# Patient Record
Sex: Male | Born: 1970 | Race: White | Hispanic: No | Marital: Married | Smoking: Never smoker
Health system: Southern US, Community
[De-identification: ages and names within clinical notes are randomized; demographics above are authoritative.]

## PROBLEM LIST (undated history)

## (undated) DIAGNOSIS — I10 Essential (primary) hypertension: Secondary | ICD-10-CM

## (undated) HISTORY — PX: ANTERIOR CRUCIATE LIGAMENT REPAIR: SHX115

---

## 1997-12-14 ENCOUNTER — Emergency Department (HOSPITAL_COMMUNITY): Admission: EM | Admit: 1997-12-14 | Discharge: 1997-12-14 | Payer: Self-pay | Admitting: Emergency Medicine

## 1997-12-21 ENCOUNTER — Emergency Department (HOSPITAL_COMMUNITY): Admission: EM | Admit: 1997-12-21 | Discharge: 1997-12-21 | Payer: Self-pay | Admitting: Emergency Medicine

## 2002-04-19 ENCOUNTER — Ambulatory Visit (HOSPITAL_COMMUNITY): Admission: RE | Admit: 2002-04-19 | Discharge: 2002-04-19 | Payer: Self-pay | Admitting: Family Medicine

## 2002-04-19 ENCOUNTER — Encounter: Payer: Self-pay | Admitting: Family Medicine

## 2002-08-27 ENCOUNTER — Emergency Department (HOSPITAL_COMMUNITY): Admission: EM | Admit: 2002-08-27 | Discharge: 2002-08-27 | Payer: Self-pay | Admitting: Emergency Medicine

## 2009-01-16 ENCOUNTER — Ambulatory Visit (HOSPITAL_COMMUNITY): Admission: RE | Admit: 2009-01-16 | Discharge: 2009-01-16 | Payer: Self-pay | Admitting: Family Medicine

## 2010-05-31 ENCOUNTER — Other Ambulatory Visit: Payer: Self-pay | Admitting: Urology

## 2010-05-31 ENCOUNTER — Other Ambulatory Visit (HOSPITAL_COMMUNITY)
Admission: RE | Admit: 2010-05-31 | Discharge: 2010-05-31 | Disposition: A | Discharge status: Home / Self Care | Payer: 59 | Source: Ambulatory Visit | Attending: Urology | Admitting: Urology

## 2010-05-31 DIAGNOSIS — Z302 Encounter for sterilization: Secondary | ICD-10-CM | POA: Insufficient documentation

## 2010-06-15 ENCOUNTER — Other Ambulatory Visit (HOSPITAL_COMMUNITY)
Admission: RE | Admit: 2010-06-15 | Discharge: 2010-06-15 | Disposition: A | Discharge status: Home / Self Care | Payer: 59 | Source: Ambulatory Visit | Attending: Urology | Admitting: Urology

## 2010-06-15 DIAGNOSIS — Z302 Encounter for sterilization: Secondary | ICD-10-CM | POA: Insufficient documentation

## 2011-01-16 IMAGING — CR DG FOOT COMPLETE 3+V*R*
2 series · 2 of 2 positions shown · non-contrast
Comparison: None

CLINICAL DATA: Pain at right lateral foot at the fourth and fifth
metatarsal region for 1 month

RIGHT FOOT COMPLETE - 3+ VIEW

[view not recorded (1 of 2)]
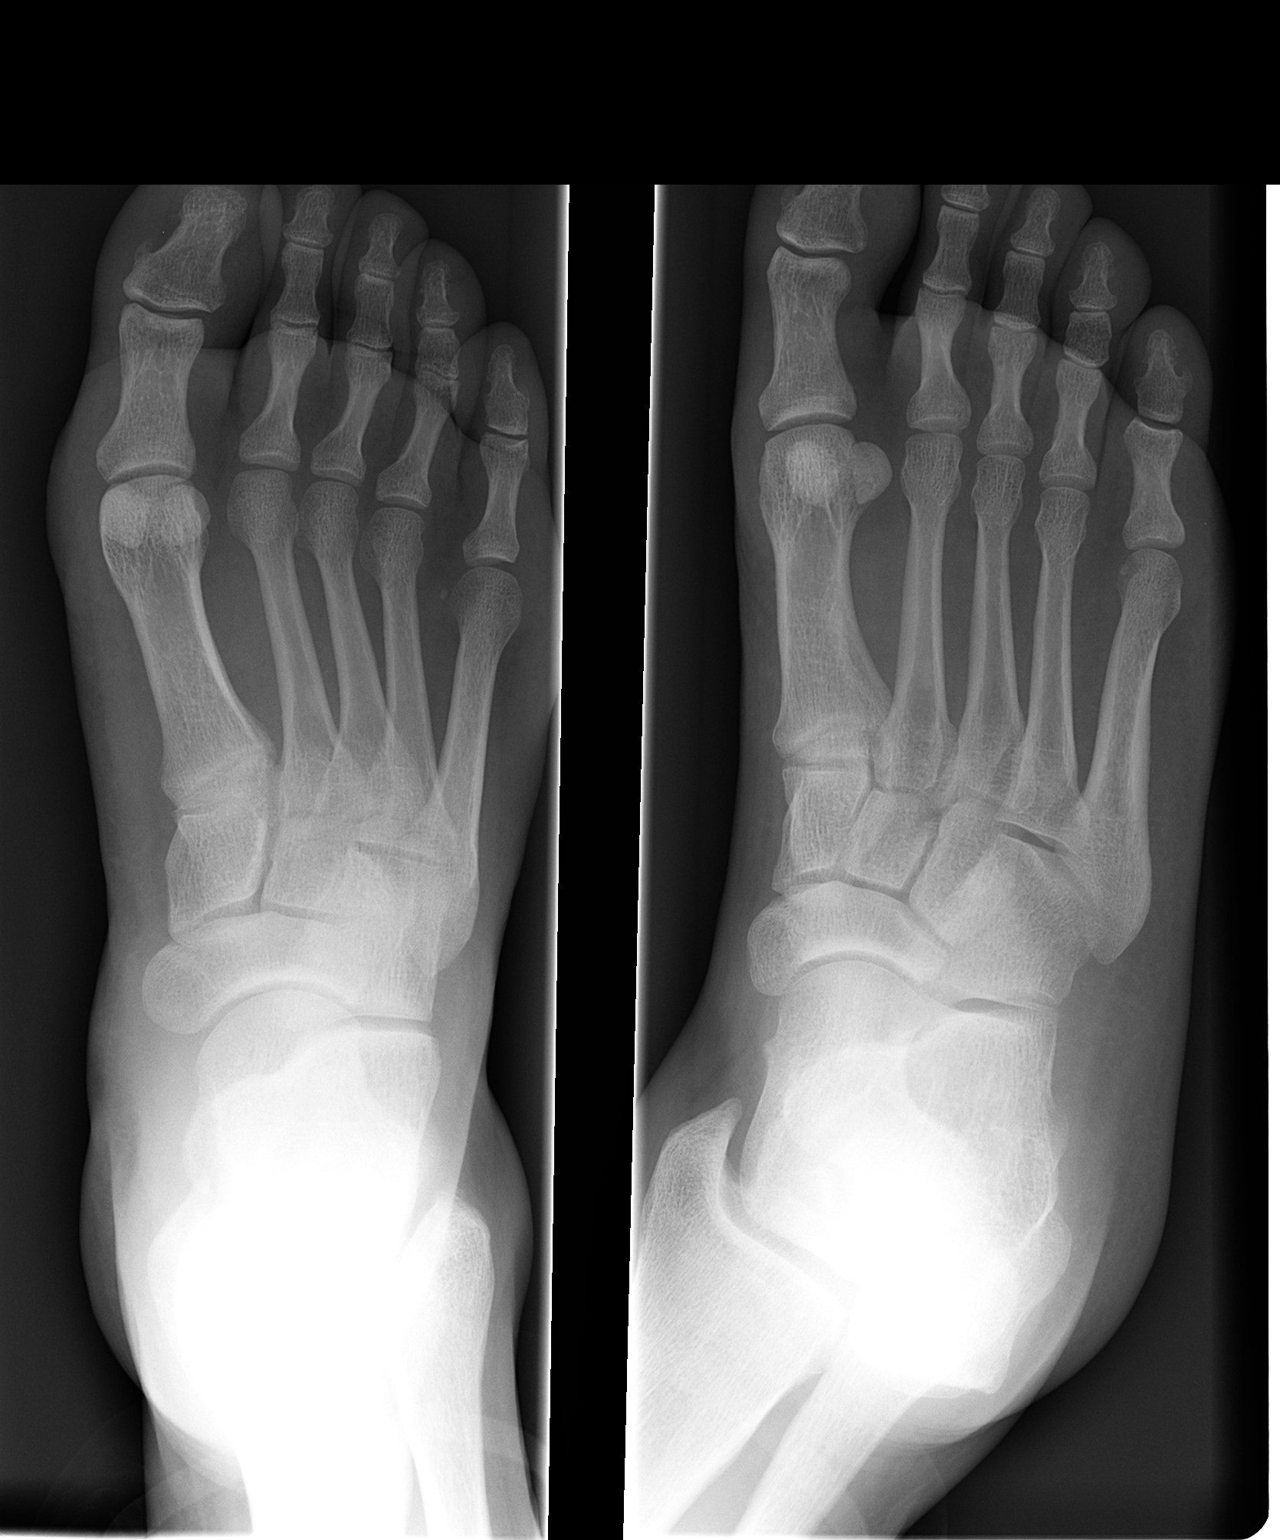

[view not recorded (2 of 2)]
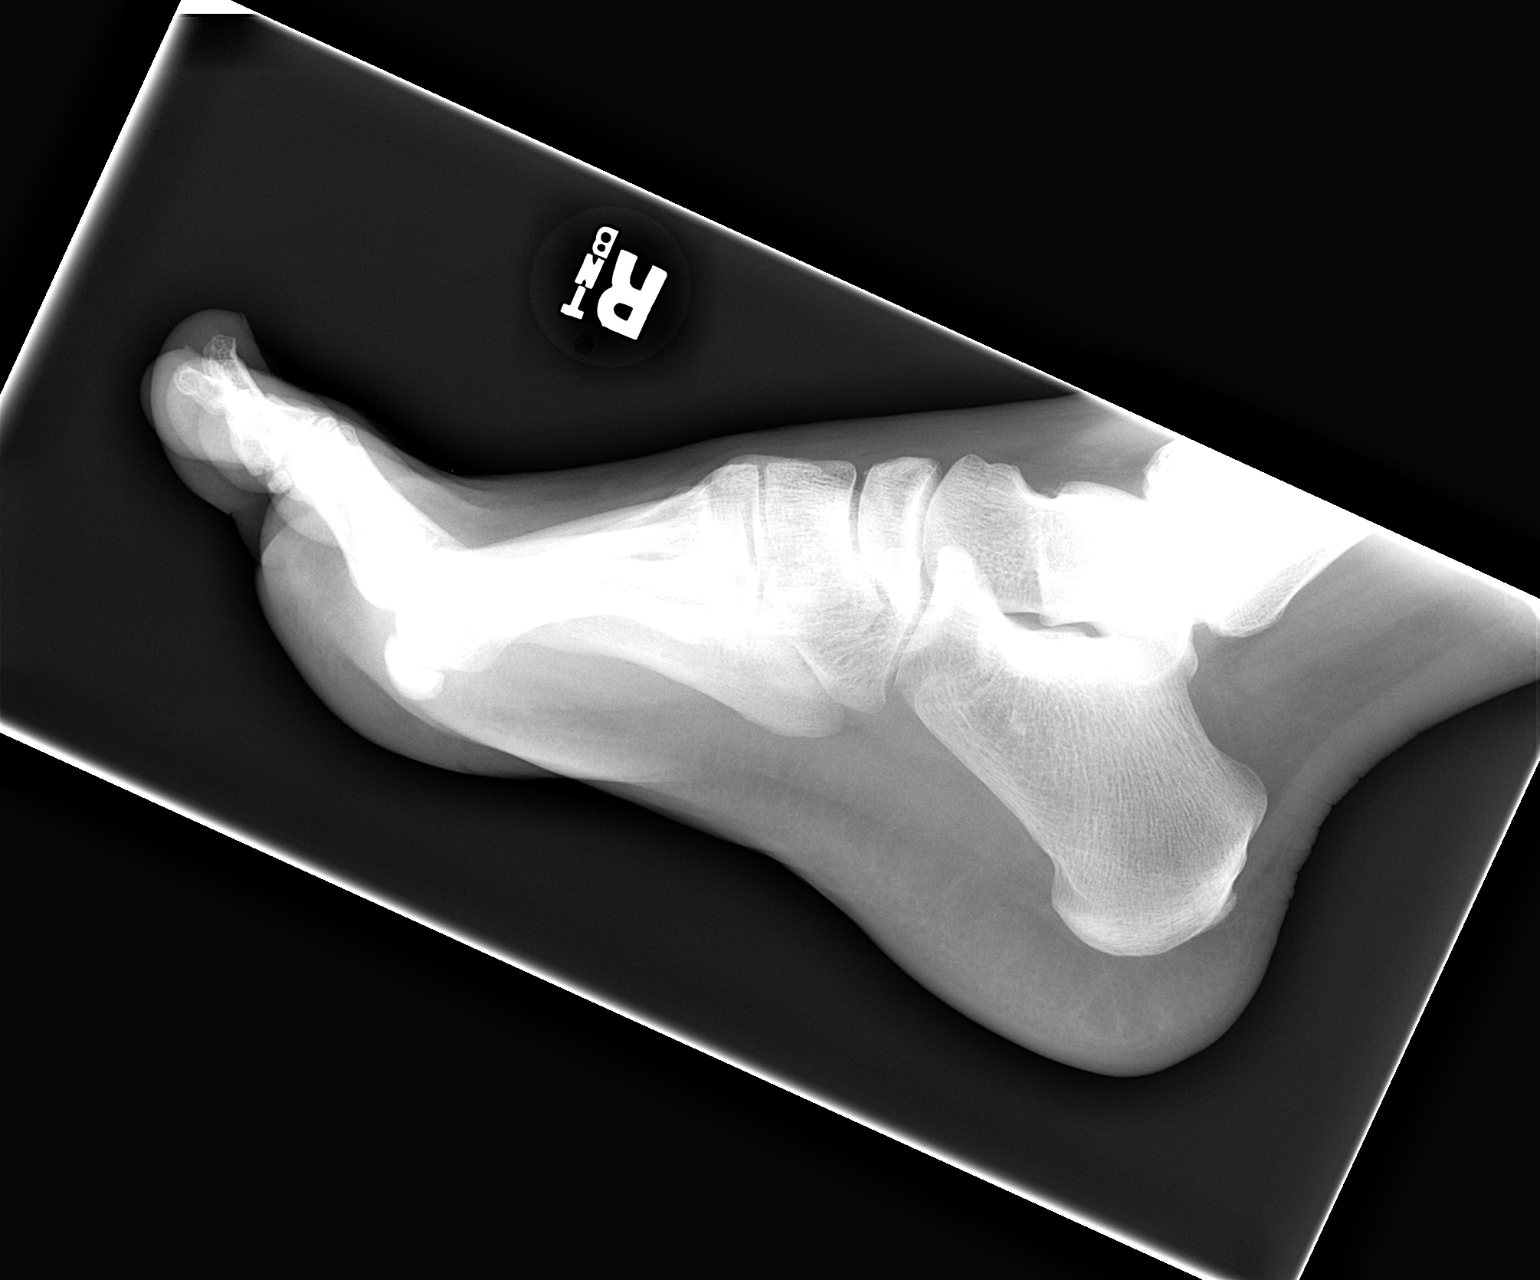

[2 of 2 positions shown; findings below may reference images not displayed]

FINDINGS: Bone mineralization normal.
Joint spaces preserved.
No acute fracture, dislocation, or bone destruction.
Congenital l fusion of middle and distal phalanges of the fifth
toe.
No acute soft tissue abnormalities seen.
IMPRESSION: No acute bony abnormalities.

## 2019-09-16 ENCOUNTER — Other Ambulatory Visit: Payer: Self-pay

## 2019-09-16 ENCOUNTER — Ambulatory Visit
Admission: EM | Admit: 2019-09-16 | Discharge: 2019-09-16 | Disposition: A | Discharge status: Home / Self Care | Payer: 59

## 2019-09-16 ENCOUNTER — Ambulatory Visit: Payer: Self-pay

## 2019-09-16 DIAGNOSIS — J0100 Acute maxillary sinusitis, unspecified: Secondary | ICD-10-CM

## 2019-09-16 DIAGNOSIS — J209 Acute bronchitis, unspecified: Secondary | ICD-10-CM

## 2019-09-16 MED ORDER — DEXAMETHASONE SODIUM PHOSPHATE 10 MG/ML IJ SOLN
10.0000 mg | Freq: Once | INTRAMUSCULAR | Status: AC
  Administered 2019-09-16: 10 mg via INTRAMUSCULAR

## 2019-09-16 MED ORDER — BENZONATATE 100 MG PO CAPS: 100.0000 mg | ORAL_CAPSULE | Freq: Three times a day (TID) | ORAL | 0 refills | Status: DC

## 2019-09-16 MED ORDER — ALBUTEROL SULFATE HFA 108 (90 BASE) MCG/ACT IN AERS: 1.0000 | INHALATION_SPRAY | Freq: Four times a day (QID) | RESPIRATORY_TRACT | 0 refills | Status: DC | PRN

## 2019-09-16 MED ORDER — PREDNISONE 10 MG (21) PO TBPK: ORAL_TABLET | Freq: Every day | ORAL | 0 refills | Status: DC

## 2019-09-16 MED ORDER — AMOXICILLIN-POT CLAVULANATE 875-125 MG PO TABS: 1.0000 | ORAL_TABLET | Freq: Two times a day (BID) | ORAL | 0 refills | Status: AC

## 2019-09-16 NOTE — Discharge Instructions (Signed)
Sinus infection:  Rest and push fluids Use OTC zyrtec, allegra, or claritin daily Use OTC flonase Use OTC ibuprofen/tylenol as needed for pain Augmentin prescribed.  Take as directed and to completion  Bronchitis: Get rest, and drink fluids Albuterol inhaler prescribed.  Use as needed for shortness of breath and/or wheezing Prednisone prescribed.  Take as directed.   Prescribed tessalone perles as needed for cough.  Use throat lozenges, hot tea, honey, and avoid second-hand smoke   Follow up with PCP as needed Return or go to the ED if you have any new or worsening symptoms such as fever, chills, worsening sinus pain/pressure, cough, sore throat, chest pain, shortness of breath, abdominal pain, changes in bowel or bladder habits, etc..Marland Kitchen

## 2019-09-16 NOTE — ED Triage Notes (Signed)
Pt presents with nasal congestion and cough. pts symptoms began Sunday

## 2019-09-16 NOTE — ED Provider Notes (Signed)
[facility]   [id number] 09/16/19 Arrival Time: 6644   CC: URI symptoms  SUBJECTIVE: History from: patient.  Jerry Cooke is a 49 y.o. male who presents with sinus pain, pressure, congestion, sore throat, nonproductive cough, and wheezing x 4 days.  Denies sick exposure to COVID, flu or strep.  Has tried OTC medications without relief.  Symptoms are made worse at night.  Reports previous symptoms in the past with URI.   Denies fever, chills, SOB, wheezing, chest pain, nausea, changes in bowel or bladder habits.    ROS: As per HPI.  All other pertinent ROS negative.     History reviewed. No pertinent past medical history. Past Surgical History:  Procedure Laterality Date  . ANTERIOR CRUCIATE LIGAMENT REPAIR     No Known Allergies No current facility-administered medications on file prior to encounter.   Current Outpatient Medications on File Prior to Encounter  Medication Sig Dispense Refill  . allopurinol (ZYLOPRIM) 100 MG tablet Take 100 mg by mouth daily.    . fenofibrate 160 MG tablet Take 160 mg by mouth daily.    Marland Kitchen gabapentin (NEURONTIN) 100 MG capsule Take 100 mg by mouth at bedtime.    Marland Kitchen lisinopril-hydrochlorothiazide (ZESTORETIC) 20-12.5 MG tablet Take 1 tablet by mouth daily.     Social History   Socioeconomic History  . Marital status: Married    Spouse name: Not on file  . Number of children: Not on file  . Years of education: Not on file  . Highest education level: Not on file  Occupational History  . Not on file  Tobacco Use  . Smoking status: Never Smoker  . Smokeless tobacco: Current User    Types: Chew  Substance and Sexual Activity  . Alcohol use: Yes  . Drug use: Not Currently  . Sexual activity: Not on file  Other Topics Concern  . Not on file  Social History Narrative  . Not on file   Social Determinants of Health   Financial Resource Strain:   . Difficulty of Paying Living Expenses:   Food Insecurity:   . Worried About Community education officer in the Last Year:   . Barista in the Last Year:   Transportation Needs:   . Freight forwarder (Medical):   Marland Kitchen Lack of Transportation (Non-Medical):   Physical Activity:   . Days of Exercise per Week:   . Minutes of Exercise per Session:   Stress:   . Feeling of Stress :   Social Connections:   . Frequency of Communication with Friends and Family:   . Frequency of Social Gatherings with Friends and Family:   . Attends Religious Services:   . Active Member of Clubs or Organizations:   . Attends Banker Meetings:   Marland Kitchen Marital Status:   Intimate Partner Violence:   . Fear of Current or Ex-Partner:   . Emotionally Abused:   Marland Kitchen Physically Abused:   . Sexually Abused:    Family History  Problem Relation Age of Onset  . Diabetes Mother     OBJECTIVE:  Vitals:   09/16/19 0850  BP: 135/83  Pulse: 97  Resp: 18  Temp: 98.2 F (36.8 C)  SpO2: 95%    General appearance: alert; appears fatigued, but nontoxic; speaking in full sentences and tolerating own secretions HEENT: NCAT; Ears: EACs clear, TMs pearly gray; Eyes: PERRL.  EOM grossly intact. Sinuses: TTP over maxillary sinuses; Nose: nares patent with clear rhinorrhea, Throat:  oropharynx clear, tonsils non erythematous or enlarged, uvula midline  Neck: supple without LAD Lungs: unlabored respirations, symmetrical air entry; cough: moderate; no respiratory distress; diffuse expiratory wheezes throughout Heart: regular rate and rhythm.  Skin: warm and dry Psychological: alert and cooperative; normal mood and affect  ASSESSMENT & PLAN:  1. Acute non-recurrent maxillary sinusitis   2. Acute bronchitis, unspecified organism     Meds ordered this encounter  Medications  . predniSONE (STERAPRED UNI-PAK 21 TAB) 10 MG (21) TBPK tablet    Sig: Take by mouth daily. Take 6 tabs by mouth daily  for 2 days, then 5 tabs for 2 days, then 4 tabs for 2 days, then 3 tabs for 2 days, 2 tabs for 2 days, then  1 tab by mouth daily for 2 days    Dispense:  42 tablet    Refill:  0    Order Specific Question:   Supervising Provider    Answer:   Eustace Moore [[id number]]  . benzonatate (TESSALON) 100 MG capsule    Sig: Take 1 capsule (100 mg total) by mouth every 8 (eight) hours.    Dispense:  21 capsule    Refill:  0    Order Specific Question:   Supervising Provider    Answer:   Eustace Moore [[id number]]  . albuterol (VENTOLIN HFA) 108 (90 Base) MCG/ACT inhaler    Sig: Inhale 1-2 puffs into the lungs every 6 (six) hours as needed for wheezing or shortness of breath.    Dispense:  18 g    Refill:  0    Order Specific Question:   Supervising Provider    Answer:   Eustace Moore [[id number]]  . amoxicillin-clavulanate (AUGMENTIN) 875-125 MG tablet    Sig: Take 1 tablet by mouth every 12 (twelve) hours for 10 days.    Dispense:  20 tablet    Refill:  0    Order Specific Question:   Supervising Provider    Answer:   Eustace Moore [[id number]]  . dexamethasone (DECADRON) injection 10 mg   Sinus infection:  Rest and push fluids Use OTC zyrtec, allegra, or claritin daily Use OTC flonase Use OTC ibuprofen/tylenol as needed for pain Augmentin prescribed.  Take as directed and to completion  Bronchitis: Get rest, and drink fluids Albuterol inhaler prescribed.  Use as needed for shortness of breath and/or wheezing Prednisone prescribed.  Take as directed.   Prescribed tessalone perles as needed for cough.  Use throat lozenges, hot tea, honey, and avoid second-hand smoke   Follow up with PCP as needed Return or go to the ED if you have any new or worsening symptoms such as fever, chills, worsening sinus pain/pressure, cough, sore throat, chest pain, shortness of breath, abdominal pain, changes in bowel or bladder habits, etc...   Reviewed expectations re: course of current medical issues. Questions answered. Outlined signs and symptoms indicating need for more acute  intervention. Patient verbalized understanding. After Visit Summary given.         Rennis Harding, PA-C 09/16/19 [phone]

## 2021-08-25 ENCOUNTER — Ambulatory Visit
Admission: EM | Admit: 2021-08-25 | Discharge: 2021-08-25 | Disposition: A | Discharge status: Home / Self Care | Payer: 59 | Attending: Nurse Practitioner | Admitting: Nurse Practitioner

## 2021-08-25 DIAGNOSIS — Z760 Encounter for issue of repeat prescription: Secondary | ICD-10-CM | POA: Diagnosis not present

## 2021-08-25 DIAGNOSIS — M25521 Pain in right elbow: Secondary | ICD-10-CM | POA: Diagnosis not present

## 2021-08-25 MED ORDER — LISINOPRIL-HYDROCHLOROTHIAZIDE 20-12.5 MG PO TABS: 1.0000 | ORAL_TABLET | Freq: Every day | ORAL | 1 refills | Status: DC

## 2021-08-25 MED ORDER — PREDNISONE 20 MG PO TABS: 40.0000 mg | ORAL_TABLET | Freq: Every day | ORAL | 0 refills | Status: AC

## 2021-08-25 MED ORDER — DEXAMETHASONE SODIUM PHOSPHATE 10 MG/ML IJ SOLN
10.0000 mg | INTRAMUSCULAR | Status: AC
  Administered 2021-08-25: 10 mg via INTRAMUSCULAR

## 2021-08-25 NOTE — ED Triage Notes (Signed)
Pt reports right elbow pain x 2 weeks. He was working with a chainsaw and felt a electric shock in the right elbow. Pt states he is not able to grasp.

## 2021-08-25 NOTE — Discharge Instructions (Addendum)
Take medication as prescribed. Continue RICE therapy to the right elbow, rest, ice, compression and elevation. Perform gentle range of motion exercises to keep the elbow joint mobile. As discussed, because her symptoms have not improved over the past 1 to 2 weeks, recommend following up with orthopedics.  You can follow-up with [facility] [place] at [phone] or [facility] in [place] at [phone]. Continue to monitor your blood pressure as discussed.  It would be helpful for you to obtain a primary care physician in order to have someone monitor your symptoms and provide continuity of care.  PCP assistance has been provided for you today.  Someone should be calling you within the week to help you find a primary care physician.

## 2021-08-25 NOTE — ED Provider Notes (Signed)
[facility]    CSN: [id number] Arrival date & time: 08/25/21  1251      History   Chief Complaint No chief complaint on file.   HPI Jerry Cooke is a 51 y.o. male.   HPI  Presents for complaints of right elbow pain.  Symptoms have been present for the past 2 to 3 weeks.  Patient states he was cutting down a tree chainsaw and trying to hold a tree with the opposite arm.  States he felt an electric shock in the right elbow.  Since that time he has had difficulty grasping and pain that starts from the elbow and radiates into the bicep into the forearm and hand/wrist.  He also has swelling at the forearm and elbow.  He denies numbness, tingling, or previous injury.  He states that lifting, and certain movement make his pain worse.  He has been taking Advil for his symptoms.  Patient is also requesting medication refill for his blood pressure medication.  He denies chest pain, shortness of breath, difficulty breathing, or lower extremity edema. History reviewed. No pertinent past medical history.  There are no problems to display for this patient.   Past Surgical History:  Procedure Laterality Date   ANTERIOR CRUCIATE LIGAMENT REPAIR         Home Medications    Prior to Admission medications   Medication Sig Start Date End Date Taking? Authorizing Provider  predniSONE (DELTASONE) 20 MG tablet Take 2 tablets (40 mg total) by mouth daily with breakfast for 5 days. 08/25/21 08/30/21 Yes Tomeka Kantner-Warren, Sadie Haberhristie J, NP  albuterol (VENTOLIN HFA) 108 (90 Base) MCG/ACT inhaler Inhale 1-2 puffs into the lungs every 6 (six) hours as needed for wheezing or shortness of breath. 09/16/19   Wurst, [place], PA-C  allopurinol (ZYLOPRIM) 100 MG tablet Take 100 mg by mouth daily. 08/09/19   [provider]  benzonatate (TESSALON) 100 MG capsule Take 1 capsule (100 mg total) by mouth every 8 (eight) hours. 09/16/19   Wurst, [place], PA-C  fenofibrate 160 MG tablet Take 160 mg by  mouth daily. 08/10/19   [provider]  gabapentin (NEURONTIN) 100 MG capsule Take 100 mg by mouth at bedtime. 06/13/19   [provider]  lisinopril-hydrochlorothiazide (ZESTORETIC) 20-12.5 MG tablet Take 1 tablet by mouth daily. 08/25/21 09/24/21  Jeyden Coffelt-Warren, Sadie Haberhristie J, NP    Family History Family History  Problem Relation Age of Onset   Diabetes Mother     Social History Social History   Tobacco Use   Smoking status: Never   Smokeless tobacco: Current    Types: Chew  Substance Use Topics   Alcohol use: Yes   Drug use: Not Currently     Allergies   Patient has no known allergies.   Review of Systems Review of Systems Per HPI  Physical Exam Triage Vital Signs ED Triage Vitals  Enc Vitals Group     BP 08/25/21 1435 (!) 181/121     Pulse Rate 08/25/21 1435 98     Resp 08/25/21 1435 20     Temp 08/25/21 1435 98.1 F (36.7 C)     Temp Source 08/25/21 1435 Oral     SpO2 08/25/21 1435 98 %     Weight --      Height --      Head Circumference --      Peak Flow --      Pain Score 08/25/21 1434 7     Pain Loc --  Pain Edu? --      Excl. in GC? --    No data found.  Updated Vital Signs BP (!) 181/121 (BP Location: Right Arm) Comment: Reporst he is not taking his meds, he has 3 bottles at home.  Pulse 98   Temp 98.1 F (36.7 C) (Oral)   Resp 20   SpO2 98%   Visual Acuity Right Eye Distance:   Left Eye Distance:   Bilateral Distance:    Right Eye Near:   Left Eye Near:    Bilateral Near:     Physical Exam Vitals and nursing note reviewed.  Constitutional:      Appearance: Normal appearance.  HENT:     Head: Normocephalic.     Nose: Nose normal.  Eyes:     Extraocular Movements: Extraocular movements intact.     Pupils: Pupils are equal, round, and reactive to light.  Cardiovascular:     Rate and Rhythm: Normal rate and regular rhythm.     Pulses: Normal pulses.     Heart sounds: Normal heart sounds.  Pulmonary:      Effort: Pulmonary effort is normal.     Breath sounds: Normal breath sounds.  Abdominal:     General: Bowel sounds are normal.     Palpations: Abdomen is soft.  Musculoskeletal:     Right elbow: Swelling present. Decreased range of motion. Tenderness present.     Cervical back: Normal range of motion.     Right lower leg: No edema.     Left lower leg: No edema.     Comments: Diffuse tenderness noted to the generalized area of the elbow into the right forearm.  Decreased grip strength noted in the right hand.  Skin:    General: Skin is warm and dry.  Neurological:     General: No focal deficit present.     Mental Status: He is alert and oriented to person, place, and time.  Psychiatric:        Mood and Affect: Mood normal.        Behavior: Behavior normal.      UC Treatments / Results  Labs (all labs ordered are listed, but only abnormal results are displayed) Labs Reviewed - No data to display  EKG   Radiology No results found.  Procedures Procedures (including critical care time)  Medications Ordered in UC Medications  dexamethasone (DECADRON) injection 10 mg (10 mg Intramuscular Given 08/25/21 1507)    Initial Impression / Assessment and Plan / UC Course  I have reviewed the triage vital signs and the nursing notes.  Pertinent labs & imaging results that were available during my care of the patient were reviewed by me and considered in my medical decision making (see chart for details).  Patient presents with right elbow pain after using a chainsaw about 2 to 3 weeks ago.  Patient has been performing symptomatic treatment at home with minimal relief.  Today he presents with continued pain in the elbow that radiates into the upper arm and into the forearm.  He has pain with gripping, lifting, and range of motion.  Based on the patient's continued symptoms, recommend that he follow-up with orthopedics.  Patient was given information for [facility] [place] and for  [facility].  In the interim, will see if corticosteroids will help his symptoms as this possibly could be inflammation.  Decadron was given in the office, and patient was given a prescription for prednisone for 5 days.  With regard to  his blood pressure, patient was given a refill for his Zestoretic.  PCP assistance was also provided for the patient today.  Patient advised to follow-up as needed. Final Clinical Impressions(s) / UC Diagnoses   Final diagnoses:  Right elbow pain  Encounter for medication refill     Discharge Instructions      Take medication as prescribed. Continue RICE therapy to the right elbow, rest, ice, compression and elevation. Perform gentle range of motion exercises to keep the elbow joint mobile. As discussed, because her symptoms have not improved over the past 1 to 2 weeks, recommend following up with orthopedics.  You can follow-up with [facility] [place] at [phone] or [facility] in [place] at [phone]. Continue to monitor your blood pressure as discussed.  It would be helpful for you to obtain a primary care physician in order to have someone monitor your symptoms and provide continuity of care.  PCP assistance has been provided for you today.  Someone should be calling you within the week to help you find a primary care physician.     ED Prescriptions     Medication Sig Dispense Auth. Provider   predniSONE (DELTASONE) 20 MG tablet Take 2 tablets (40 mg total) by mouth daily with breakfast for 5 days. 10 tablet Sandar Krinke-Warren, Sadie Haber, NP   lisinopril-hydrochlorothiazide (ZESTORETIC) 20-12.5 MG tablet Take 1 tablet by mouth daily. 30 tablet Maeven Mcdougall-Warren, Sadie Haber, NP      PDMP not reviewed this encounter.   Abran Cantor, NP 08/25/21 1510

## 2022-03-08 ENCOUNTER — Ambulatory Visit
Admission: RE | Admit: 2022-03-08 | Discharge: 2022-03-08 | Disposition: A | Discharge status: Home / Self Care | Payer: 59 | Source: Ambulatory Visit

## 2022-03-08 VITALS — BP 142/104 | HR 112 | Temp 98.6°F | Resp 16

## 2022-03-08 DIAGNOSIS — Z760 Encounter for issue of repeat prescription: Secondary | ICD-10-CM

## 2022-03-08 DIAGNOSIS — I10 Essential (primary) hypertension: Secondary | ICD-10-CM

## 2022-03-08 DIAGNOSIS — H6993 Unspecified Eustachian tube disorder, bilateral: Secondary | ICD-10-CM | POA: Diagnosis not present

## 2022-03-08 MED ORDER — CETIRIZINE HCL 10 MG PO TABS: 10.0000 mg | ORAL_TABLET | Freq: Every day | ORAL | 0 refills | Status: DC

## 2022-03-08 MED ORDER — LISINOPRIL-HYDROCHLOROTHIAZIDE 20-12.5 MG PO TABS: 1.0000 | ORAL_TABLET | Freq: Every day | ORAL | 0 refills | Status: DC

## 2022-03-08 MED ORDER — FLUTICASONE PROPIONATE 50 MCG/ACT NA SUSP: 2.0000 | Freq: Every day | NASAL | 0 refills | Status: DC

## 2022-03-08 NOTE — ED Triage Notes (Signed)
Pt presents with he has fluid in ears x 2 days.   Pt requested lisinopril-hydrochlorothiazide 20-12.5 mg.,

## 2022-03-08 NOTE — ED Provider Notes (Signed)
[facility]    CSN: [id number] Arrival date & time: 03/08/22  1516      History   Chief Complaint Chief Complaint  Patient presents with   Appointment    1530    HPI Jerry Cooke is a 51 y.o. male.   The history is provided by the patient.   The patient presents for complaints of bilateral ear fullness and pressure that been present over the past 2 days.  Patient denies fever, chills, headache, sore throat, ear drainage, cough, or nasal congestion.  States "I think to have fluid in my ears".  He also presents for request for a medication refill of his lisinopril-hydrochlorothiazide 20-12.5 mg.  Patient states that his last medication was prescribed when he was in this clinic in June 2023.  He states that he has not been followed up by a primary care physician since that time.  He denies chest pain, shortness of breath, difficulty breathing, or lower extremity edema.  Patient reports that he does plan to see a primary care physician.  History reviewed. No pertinent past medical history.  There are no problems to display for this patient.   Past Surgical History:  Procedure Laterality Date   ANTERIOR CRUCIATE LIGAMENT REPAIR         Home Medications    Prior to Admission medications   Medication Sig Start Date End Date Taking? Authorizing Provider  cetirizine (ZYRTEC) 10 MG tablet Take 1 tablet (10 mg total) by mouth daily. 03/08/22  Yes Nichlos Kunzler-Warren, Alda Lea, NP  fluticasone (FLONASE) 50 MCG/ACT nasal spray Place 2 sprays into both nostrils daily. 03/08/22  Yes Marlyn Tondreau Viscomi-Warren, Alda Lea, NP  albuterol (VENTOLIN HFA) 108 (90 Base) MCG/ACT inhaler Inhale 1-2 puffs into the lungs every 6 (six) hours as needed for wheezing or shortness of breath. 09/16/19   Wurst, [place], PA-C  allopurinol (ZYLOPRIM) 100 MG tablet Take 100 mg by mouth daily. 08/09/19   [provider]  benzonatate (TESSALON) 100 MG capsule Take 1 capsule (100 mg total) by mouth every  8 (eight) hours. 09/16/19   Wurst, [place], PA-C  fenofibrate 160 MG tablet Take 160 mg by mouth daily. 08/10/19   [provider]  gabapentin (NEURONTIN) 100 MG capsule Take 100 mg by mouth at bedtime. 06/13/19   [provider]  lisinopril-hydrochlorothiazide (ZESTORETIC) 20-12.5 MG tablet Take 1 tablet by mouth daily. 03/08/22 04/07/22  Audrina Marten-Warren, Alda Lea, NP  meloxicam (MOBIC) 7.5 MG tablet TAKE 1 TABLET BY MOUTH TWICE DAILY FOR 2 WEEKS THEN AS NEEDED    [provider]    Family History Family History  Problem Relation Age of Onset   Diabetes Mother     Social History Social History   Tobacco Use   Smoking status: Never   Smokeless tobacco: Current    Types: Chew  Substance Use Topics   Alcohol use: Yes   Drug use: Not Currently     Allergies   Patient has no known allergies.   Review of Systems Review of Systems Per HPI  Physical Exam Triage Vital Signs ED Triage Vitals  Enc Vitals Group     BP 03/08/22 1620 (!) 142/104     Pulse Rate 03/08/22 1620 (!) 112     Resp 03/08/22 1620 16     Temp 03/08/22 1620 98.6 F (37 C)     Temp Source 03/08/22 1620 Oral     SpO2 --      Weight --  Height --      Head Circumference --      Peak Flow --      Pain Score 03/08/22 1623 0     Pain Loc --      Pain Edu? --      Excl. in GC? --    No data found.  Updated Vital Signs BP (!) 142/104 (BP Location: Right Arm)   Pulse (!) 112   Temp 98.6 F (37 C) (Oral)   Resp 16   Visual Acuity Right Eye Distance:   Left Eye Distance:   Bilateral Distance:    Right Eye Near:   Left Eye Near:    Bilateral Near:     Physical Exam Vitals and nursing note reviewed.  Constitutional:      General: He is not in acute distress.    Appearance: Normal appearance.  HENT:     Head: Normocephalic.     Right Ear: Ear canal and external ear normal. A middle ear effusion is present.     Left Ear: Ear canal and external ear normal. A middle  ear effusion is present.     Nose: Nose normal.     Right Turbinates: Enlarged and swollen.     Left Turbinates: Enlarged and swollen.     Right Sinus: No maxillary sinus tenderness or frontal sinus tenderness.     Left Sinus: No maxillary sinus tenderness or frontal sinus tenderness.     Mouth/Throat:     Lips: Pink.     Mouth: Mucous membranes are moist.     Pharynx: Uvula midline. No pharyngeal swelling or posterior oropharyngeal erythema.  Eyes:     Extraocular Movements: Extraocular movements intact.     Conjunctiva/sclera: Conjunctivae normal.     Pupils: Pupils are equal, round, and reactive to light.  Cardiovascular:     Rate and Rhythm: Normal rate and regular rhythm.     Pulses: Normal pulses.     Heart sounds: Normal heart sounds.  Pulmonary:     Effort: Pulmonary effort is normal. No respiratory distress.     Breath sounds: Normal breath sounds. No stridor. No wheezing, rhonchi or rales.  Abdominal:     General: Bowel sounds are normal.     Palpations: Abdomen is soft.     Tenderness: There is no abdominal tenderness.  Musculoskeletal:     Cervical back: Normal range of motion.  Lymphadenopathy:     Cervical: No cervical adenopathy.  Skin:    General: Skin is warm and dry.  Neurological:     General: No focal deficit present.     Mental Status: He is alert and oriented to person, place, and time.  Psychiatric:        Mood and Affect: Mood normal.        Behavior: Behavior normal.      UC Treatments / Results  Labs (all labs ordered are listed, but only abnormal results are displayed) Labs Reviewed - No data to display  EKG   Radiology No results found.  Procedures Procedures (including critical care time)  Medications Ordered in UC Medications - No data to display  Initial Impression / Assessment and Plan / UC Course  I have reviewed the triage vital signs and the nursing notes.  Pertinent labs & imaging results that were available during my  care of the patient were reviewed by me and considered in my medical decision making (see chart for details).  The patient is well-appearing, he is  in no acute distress, vital signs are stable.  With regard to his elevated blood pressure, patient is asymptomatic at this time.  Suspect eustachian tube dysfunction.  Will treat with cetirizine 10 mg, and fluticasone 50 mcg nasal spray.  With regard to his medication refill, patient's Zestoretic was provided a 30-day supply.  Patient was advised to follow-up with primary care with the information that was provided today to establish care.  Patient was given strict indications of when to go to the emergency department.  Patient verbalizes understanding.  All questions were answered.  Patient is stable for discharge.   Final Clinical Impressions(s) / UC Diagnoses   Final diagnoses:  Eustachian tube dysfunction, bilateral  Encounter for medication refill  Essential hypertension     Discharge Instructions      Take medication as prescribed. Increase fluids and allow for plenty of rest. Avoid getting water inside of the ear while symptoms persist. Also recommend warm compresses to the ear to help with any pain or swelling.  May take over-the-counter Tylenol as needed for pain or discomfort.  Please follow-up with a primary care physician using the information that was provided to you today. Based on your current blood pressure readings, you need to follow-up with a primary care physician within the next 30 days to prevent a heart attack or stroke. Recommend modifying the salt intake in your diet until you can be seen by primary care physician. Go to the emergency department if you develop chest pain, shortness of breath, difficulty breathing, or other concerns. Please follow-up as needed.     ED Prescriptions     Medication Sig Dispense Auth. Provider   lisinopril-hydrochlorothiazide (ZESTORETIC) 20-12.5 MG tablet Take 1 tablet by mouth  daily. 30 tablet Jaun Galluzzo-Warren, Alda Lea, NP   fluticasone (FLONASE) 50 MCG/ACT nasal spray Place 2 sprays into both nostrils daily. 16 g Tobe Kervin-Warren, Alda Lea, NP   cetirizine (ZYRTEC) 10 MG tablet Take 1 tablet (10 mg total) by mouth daily. 30 tablet Salvatrice Morandi-Warren, Alda Lea, NP      PDMP not reviewed this encounter.   Tish Men, NP 03/08/22 1650

## 2022-03-08 NOTE — Discharge Instructions (Addendum)
Take medication as prescribed. Increase fluids and allow for plenty of rest. Avoid getting water inside of the ear while symptoms persist. Also recommend warm compresses to the ear to help with any pain or swelling.  May take over-the-counter Tylenol as needed for pain or discomfort.  Please follow-up with a primary care physician using the information that was provided to you today. Based on your current blood pressure readings, you need to follow-up with a primary care physician within the next 30 days to prevent a heart attack or stroke. Recommend modifying the salt intake in your diet until you can be seen by primary care physician. Go to the emergency department if you develop chest pain, shortness of breath, difficulty breathing, or other concerns. Please follow-up as needed.

## 2022-03-25 ENCOUNTER — Encounter: Payer: Self-pay | Admitting: Internal Medicine

## 2022-03-25 ENCOUNTER — Ambulatory Visit (INDEPENDENT_AMBULATORY_CARE_PROVIDER_SITE_OTHER): Payer: No Typology Code available for payment source | Admitting: Internal Medicine

## 2022-03-25 VITALS — BP 157/101 | HR 80 | Ht 70.0 in | Wt 222.2 lb

## 2022-03-25 DIAGNOSIS — Z0001 Encounter for general adult medical examination with abnormal findings: Secondary | ICD-10-CM

## 2022-03-25 DIAGNOSIS — M7671 Peroneal tendinitis, right leg: Secondary | ICD-10-CM

## 2022-03-25 DIAGNOSIS — E781 Pure hyperglyceridemia: Secondary | ICD-10-CM

## 2022-03-25 DIAGNOSIS — Z1211 Encounter for screening for malignant neoplasm of colon: Secondary | ICD-10-CM

## 2022-03-25 DIAGNOSIS — E782 Mixed hyperlipidemia: Secondary | ICD-10-CM

## 2022-03-25 DIAGNOSIS — Z1329 Encounter for screening for other suspected endocrine disorder: Secondary | ICD-10-CM

## 2022-03-25 DIAGNOSIS — Z1159 Encounter for screening for other viral diseases: Secondary | ICD-10-CM | POA: Diagnosis not present

## 2022-03-25 DIAGNOSIS — Z114 Encounter for screening for human immunodeficiency virus [HIV]: Secondary | ICD-10-CM

## 2022-03-25 DIAGNOSIS — Z8739 Personal history of other diseases of the musculoskeletal system and connective tissue: Secondary | ICD-10-CM

## 2022-03-25 DIAGNOSIS — Z131 Encounter for screening for diabetes mellitus: Secondary | ICD-10-CM

## 2022-03-25 DIAGNOSIS — I10 Essential (primary) hypertension: Secondary | ICD-10-CM | POA: Insufficient documentation

## 2022-03-25 DIAGNOSIS — Z1321 Encounter for screening for nutritional disorder: Secondary | ICD-10-CM

## 2022-03-25 DIAGNOSIS — Z1212 Encounter for screening for malignant neoplasm of rectum: Secondary | ICD-10-CM

## 2022-03-25 MED ORDER — LISINOPRIL-HYDROCHLOROTHIAZIDE 20-12.5 MG PO TABS: 1.0000 | ORAL_TABLET | Freq: Every day | ORAL | 0 refills | Status: DC

## 2022-03-25 NOTE — Progress Notes (Signed)
New Patient Office Visit  Subjective    Patient ID: Jerry Cooke, male    DOB: [birth year]  Age: 52 y.o. MRN: [id number]  CC:  Chief Complaint  Patient presents with   Establish Care   HPI Jerry Cooke presents to establish care and he is a 52 year old male with a past medical history significant for hypertension, hypertriglyceridemia, gout, and current tobacco use.  He is also currently followed by [facility] for a right ankle injury.  He has not a primary care provider since childhood.  Jerry Cooke reports feeling well today.  His acute concern is recent elevated blood pressure readings despite taking his medication as prescribed.  He is otherwise asymptomatic.  Acute concerns, chronic medical conditions, and outstanding preventative care items discussed today are individually addressed in A/P below.  Outpatient Encounter Medications as of 03/25/2022  Medication Sig   fluticasone (FLONASE) 50 MCG/ACT nasal spray Place 2 sprays into both nostrils daily.   [DISCONTINUED] lisinopril-hydrochlorothiazide (ZESTORETIC) 20-12.5 MG tablet Take 1 tablet by mouth daily.   lisinopril-hydrochlorothiazide (ZESTORETIC) 20-12.5 MG tablet Take 1 tablet by mouth daily.   [DISCONTINUED] albuterol (VENTOLIN HFA) 108 (90 Base) MCG/ACT inhaler Inhale 1-2 puffs into the lungs every 6 (six) hours as needed for wheezing or shortness of breath.   [DISCONTINUED] allopurinol (ZYLOPRIM) 100 MG tablet Take 100 mg by mouth daily.   [DISCONTINUED] benzonatate (TESSALON) 100 MG capsule Take 1 capsule (100 mg total) by mouth every 8 (eight) hours.   [DISCONTINUED] cetirizine (ZYRTEC) 10 MG tablet Take 1 tablet (10 mg total) by mouth daily.   [DISCONTINUED] fenofibrate 160 MG tablet Take 160 mg by mouth daily.   [DISCONTINUED] gabapentin (NEURONTIN) 100 MG capsule Take 100 mg by mouth at bedtime.   [DISCONTINUED] meloxicam (MOBIC) 7.5 MG tablet TAKE 1 TABLET BY MOUTH TWICE DAILY FOR 2 WEEKS THEN AS NEEDED   No  facility-administered encounter medications on file as of 03/25/2022.    History reviewed. No pertinent past medical history.  Past Surgical History:  Procedure Laterality Date   ANTERIOR CRUCIATE LIGAMENT REPAIR      Family History  Problem Relation Age of Onset   Diabetes Mother     Social History   Socioeconomic History   Marital status: Married    Spouse name: Not on file   Number of children: Not on file   Years of education: Not on file   Highest education level: Not on file  Occupational History   Not on file  Tobacco Use   Smoking status: Never   Smokeless tobacco: Current    Types: Chew  Substance and Sexual Activity   Alcohol use: Yes   Drug use: Not Currently   Sexual activity: Yes  Other Topics Concern   Not on file  Social History Narrative   ** Merged History Encounter **       Social Determinants of Health   Financial Resource Strain: Not on file  Food Insecurity: Not on file  Transportation Needs: Not on file  Physical Activity: Not on file  Stress: Not on file  Social Connections: Not on file  Intimate Partner Violence: Not on file    Review of Systems  Musculoskeletal:  Positive for joint pain (Right ankle pain (chronic)).  All other systems reviewed and are negative.   Objective    BP (!) 157/101   Pulse 80   Ht 5\' 10"  (1.778 m)   Wt 222 lb 3.2 oz (100.8 kg)   SpO2 97%  BMI 31.88 kg/m   Physical Exam Vitals reviewed.  Constitutional:      General: He is not in acute distress.    Appearance: Normal appearance. He is obese. He is not ill-appearing.  HENT:     Head: Normocephalic and atraumatic.     Right Ear: External ear normal.     Left Ear: External ear normal.     Nose: Nose normal. No congestion or rhinorrhea.     Mouth/Throat:     Mouth: Mucous membranes are moist.     Pharynx: Oropharynx is clear.  Eyes:     General: No scleral icterus.    Extraocular Movements: Extraocular movements intact.      Conjunctiva/sclera: Conjunctivae normal.     Pupils: Pupils are equal, round, and reactive to light.  Cardiovascular:     Rate and Rhythm: Normal rate and regular rhythm.     Pulses: Normal pulses.     Heart sounds: Normal heart sounds. No murmur heard. Pulmonary:     Effort: Pulmonary effort is normal.     Breath sounds: Normal breath sounds. No wheezing, rhonchi or rales.  Abdominal:     General: Abdomen is flat. Bowel sounds are normal. There is no distension.     Palpations: Abdomen is soft.     Tenderness: There is no abdominal tenderness.  Musculoskeletal:        General: No swelling or deformity. Normal range of motion.     Cervical back: Normal range of motion.  Skin:    General: Skin is warm and dry.     Capillary Refill: Capillary refill takes less than 2 seconds.  Neurological:     General: No focal deficit present.     Mental Status: He is alert and oriented to person, place, and time.     Motor: No weakness.  Psychiatric:        Mood and Affect: Mood normal.        Behavior: Behavior normal.        Thought Content: Thought content normal.    Assessment & Plan:   Problem List Items Addressed This Visit       Hypertension    He endorses a history of hypertension and has recently been prescribed lisinopril-HCTZ 20-12.5 mg daily.  His blood pressure today was initially 166/98 and improved to 157/101. -Lisinopril-HCTZ has been refilled today -Follow-up in 1 month for HTN check.  In the interim I have asked that he keep a blood pressure log.      Peroneal tendinitis of lower leg, right    Followed by [facility].  He reports that he will start PT tomorrow.      Hypertriglyceridemia    Reported previous history of hypertriglyceridemia.  He was previously prescribed fenofibrate but is not currently on any medication. -Lipid panel ordered today      History of gout    Previously prescribed allopurinol but is not currently on any medication.  No recent gout  flares.  No changes today.      Encounter for general adult medical examination with abnormal findings - Primary    Presenting today to establish care.  Previous records have been reviewed. -Baseline labs ordered today, including one-time HIV/HCV screening -He reports that he has received multiple vaccines at his pharmacy [facility]).  We will attempt to obtain records. -Cologuard has been ordered today for colorectal cancer screening -We will tentatively plan for follow-up in 1 month      Return in about 4  weeks (around 04/22/2022) for HTN.   [name], MD

## 2022-03-25 NOTE — Assessment & Plan Note (Signed)
Followed by [facility].  He reports that he will start PT tomorrow.

## 2022-03-25 NOTE — Assessment & Plan Note (Signed)
Previously prescribed allopurinol but is not currently on any medication.  No recent gout flares.  No changes today.

## 2022-03-25 NOTE — Assessment & Plan Note (Signed)
Reported previous history of hypertriglyceridemia.  He was previously prescribed fenofibrate but is not currently on any medication. -Lipid panel ordered today

## 2022-03-25 NOTE — Assessment & Plan Note (Signed)
Presenting today to establish care.  Previous records have been reviewed. -Baseline labs ordered today, including one-time HIV/HCV screening -He reports that he has received multiple vaccines at his pharmacy [facility]  We will attempt to obtain records. -Cologuard has been ordered today for colorectal cancer screening -We will tentatively plan for follow-up in 1 month

## 2022-03-25 NOTE — Patient Instructions (Signed)
It was a pleasure to see you today.  Thank you for giving [place] the opportunity to be involved in your care.  Below is a brief recap of your visit and next steps.  We will plan to see you again in 4 weeks.  Summary You have established care today We will check labs Lisinopril-HCTZ has been refilled Cologuard has been ordered We will follow up in 4 weeks to check your blood pressure

## 2022-03-25 NOTE — Assessment & Plan Note (Signed)
He endorses a history of hypertension and has recently been prescribed lisinopril-HCTZ 20-12.5 mg daily.  His blood pressure today was initially 166/98 and improved to 157/101. -Lisinopril-HCTZ has been refilled today -Follow-up in 1 month for HTN check.  In the interim I have asked that he keep a blood pressure log.

## 2022-03-26 NOTE — Progress Notes (Signed)
Labs from 1/8 reviewed.  A1c is in the prediabetic range.  Total cholesterol and LDL are acceptable.  His triglycerides are mildly elevated.  Mildly hypokalemic.  Can repeat BMP at follow-up.  Remaining labs are WNL.

## 2022-03-27 LAB — LIPID PANEL
Chol/HDL Ratio: 3.9 ratio (ref 0.0–5.0)
Cholesterol, Total: 177 mg/dL (ref 100–199)
HDL: 45 mg/dL (ref >39)
LDL Chol Calc (NIH): 89 mg/dL (ref 0–99)
Triglycerides: 259 mg/dL — ABNORMAL HIGH (ref 0–149)
VLDL Cholesterol Cal: 43 mg/dL — ABNORMAL HIGH (ref 5–40)

## 2022-03-27 LAB — CMP14+EGFR
ALT: 13 IU/L (ref 0–44)
AST: 16 IU/L (ref 0–40)
Albumin/Globulin Ratio: 2 (ref 1.2–2.2)
Albumin: 4.6 g/dL (ref 3.8–4.9)
Alkaline Phosphatase: 115 IU/L (ref 44–121)
BUN/Creatinine Ratio: 19 (ref 9–20)
BUN: 14 mg/dL (ref 6–24)
Bilirubin Total: 0.4 mg/dL (ref 0.0–1.2)
CO2: 22 mmol/L (ref 20–29)
Calcium: 9.1 mg/dL (ref 8.7–10.2)
Chloride: 100 mmol/L (ref 96–106)
Creatinine, Ser: 0.74 mg/dL — ABNORMAL LOW (ref 0.76–1.27)
Globulin, Total: 2.3 g/dL (ref 1.5–4.5)
Glucose: 108 mg/dL — ABNORMAL HIGH (ref 70–99)
Potassium: 3.3 mmol/L — ABNORMAL LOW (ref 3.5–5.2)
Sodium: 139 mmol/L (ref 134–144)
Total Protein: 6.9 g/dL (ref 6.0–8.5)
eGFR: 110 mL/min/{1.73_m2} (ref >59)

## 2022-03-27 LAB — CBC WITH DIFFERENTIAL/PLATELET
Basophils Absolute: 0 10*3/uL (ref 0.0–0.2)
Basos: 0 %
EOS (ABSOLUTE): 0.1 10*3/uL (ref 0.0–0.4)
Eos: 1 %
Hematocrit: 41.7 % (ref 37.5–51.0)
Hemoglobin: 14.5 g/dL (ref 13.0–17.7)
Immature Grans (Abs): 0 10*3/uL (ref 0.0–0.1)
Immature Granulocytes: 0 %
Lymphocytes Absolute: 1.4 10*3/uL (ref 0.7–3.1)
Lymphs: 19 %
MCH: 29.7 pg (ref 26.6–33.0)
MCHC: 34.8 g/dL (ref 31.5–35.7)
MCV: 85 fL (ref 79–97)
Monocytes Absolute: 0.4 10*3/uL (ref 0.1–0.9)
Monocytes: 5 %
Neutrophils Absolute: 5.5 10*3/uL (ref 1.4–7.0)
Neutrophils: 75 %
Platelets: 187 10*3/uL (ref 150–450)
RBC: 4.89 x10E6/uL (ref 4.14–5.80)
RDW: 13.2 % (ref 11.6–15.4)
WBC: 7.4 10*3/uL (ref 3.4–10.8)

## 2022-03-27 LAB — VITAMIN D 25 HYDROXY (VIT D DEFICIENCY, FRACTURES): Vit D, 25-Hydroxy: 41.9 ng/mL (ref 30.0–100.0)

## 2022-03-27 LAB — HEMOGLOBIN A1C
Est. average glucose Bld gHb Est-mCnc: 120 mg/dL
Hgb A1c MFr Bld: 5.8 % — ABNORMAL HIGH (ref 4.8–5.6)

## 2022-03-27 LAB — B12 AND FOLATE PANEL
Folate: 14.3 ng/mL (ref >3.0)
Vitamin B-12: 704 pg/mL (ref 232–1245)

## 2022-03-27 LAB — HCV INTERPRETATION

## 2022-03-27 LAB — HCV AB W REFLEX TO QUANT PCR: HCV Ab: NONREACTIVE

## 2022-03-27 LAB — TSH+FREE T4
Free T4: 1.63 ng/dL (ref 0.82–1.77)
TSH: 0.868 u[IU]/mL (ref 0.450–4.500)

## 2022-03-27 LAB — HIV ANTIBODY (ROUTINE TESTING W REFLEX): HIV Screen 4th Generation wRfx: NONREACTIVE

## 2022-04-10 LAB — COLOGUARD: COLOGUARD: NEGATIVE

## 2022-04-22 ENCOUNTER — Encounter: Payer: Self-pay | Admitting: Internal Medicine

## 2022-04-22 ENCOUNTER — Ambulatory Visit (INDEPENDENT_AMBULATORY_CARE_PROVIDER_SITE_OTHER): Payer: No Typology Code available for payment source | Admitting: Internal Medicine

## 2022-04-22 VITALS — BP 128/83 | HR 97 | Ht 70.0 in | Wt 219.8 lb

## 2022-04-22 DIAGNOSIS — E781 Pure hyperglyceridemia: Secondary | ICD-10-CM | POA: Diagnosis not present

## 2022-04-22 DIAGNOSIS — R7303 Prediabetes: Secondary | ICD-10-CM

## 2022-04-22 DIAGNOSIS — E876 Hypokalemia: Secondary | ICD-10-CM

## 2022-04-22 DIAGNOSIS — I10 Essential (primary) hypertension: Secondary | ICD-10-CM

## 2022-04-22 DIAGNOSIS — Z23 Encounter for immunization: Secondary | ICD-10-CM

## 2022-04-22 MED ORDER — HYDROCHLOROTHIAZIDE 25 MG PO TABS: 12.5000 mg | ORAL_TABLET | Freq: Every day | ORAL | 3 refills | Status: DC

## 2022-04-22 MED ORDER — LISINOPRIL 40 MG PO TABS: 40.0000 mg | ORAL_TABLET | Freq: Every day | ORAL | 3 refills | Status: DC

## 2022-04-22 MED ORDER — LOSARTAN POTASSIUM 50 MG PO TABS: 50.0000 mg | ORAL_TABLET | Freq: Every day | ORAL | 2 refills | Status: DC

## 2022-04-22 NOTE — Progress Notes (Unsigned)
Established Patient Office Visit  Subjective   Patient ID: Jerry Cooke, male    DOB: 12/28/1970  Age: 52 y.o. MRN: [id number]  Chief Complaint  Patient presents with   Hypertension   Jerry Cooke returns to care today for HTN follow-up.  He was last seen by me on 1/8 as a new patient presenting to establish care.  He endorsed a history of hypertension at that time and stated that he was prescribed lisinopril-HCTZ 20-12.5 mg daily.  His blood pressure was significantly elevated.  Lisinopril-HCTZ was refilled and 1 month follow-up was arranged for HTN check.  There have been no acute interval events.  Jerry Cooke reports feeling well today.  He is asymptomatic and has no additional concerns to discuss.  He has been checking his blood pressure at home and reports systolic pressures consistently 130-140s.  History reviewed. No pertinent past medical history. Past Surgical History:  Procedure Laterality Date   ANTERIOR CRUCIATE LIGAMENT REPAIR     Social History   Tobacco Use   Smoking status: Never   Smokeless tobacco: Current    Types: Chew  Substance Use Topics   Alcohol use: Yes   Drug use: Not Currently   Family History  Problem Relation Age of Onset   Diabetes Mother    No Known Allergies  Review of Systems  Constitutional:  Negative for chills and fever.  HENT:  Negative for sore throat.   Respiratory:  Negative for cough and shortness of breath.   Cardiovascular:  Negative for chest pain, palpitations and leg swelling.  Gastrointestinal:  Negative for abdominal pain, blood in stool, constipation, diarrhea, nausea and vomiting.  Genitourinary:  Negative for dysuria and hematuria.  Musculoskeletal:  Negative for myalgias.  Skin:  Negative for itching and rash.  Neurological:  Negative for dizziness and headaches.  Psychiatric/Behavioral:  Negative for depression and suicidal ideas.      Objective:     BP 128/83   Pulse 97   Ht 5\' 10"  (1.778 m)   Wt 219 lb 12.8 oz (99.7  kg)   SpO2 97%   BMI 31.54 kg/m  BP Readings from Last 3 Encounters:  04/22/22 128/83  03/25/22 (!) 157/101  03/08/22 (!) 142/104   Physical Exam Vitals reviewed.  Constitutional:      General: He is not in acute distress.    Appearance: Normal appearance. He is obese. He is not ill-appearing.  HENT:     Head: Normocephalic and atraumatic.     Right Ear: External ear normal.     Left Ear: External ear normal.     Nose: Nose normal. No congestion or rhinorrhea.     Mouth/Throat:     Mouth: Mucous membranes are moist.     Pharynx: Oropharynx is clear.  Eyes:     General: No scleral icterus.    Extraocular Movements: Extraocular movements intact.     Conjunctiva/sclera: Conjunctivae normal.     Pupils: Pupils are equal, round, and reactive to light.  Cardiovascular:     Rate and Rhythm: Normal rate and regular rhythm.     Pulses: Normal pulses.     Heart sounds: Normal heart sounds. No murmur heard. Pulmonary:     Effort: Pulmonary effort is normal.     Breath sounds: Normal breath sounds. No wheezing, rhonchi or rales.  Abdominal:     General: Abdomen is flat. Bowel sounds are normal. There is no distension.     Palpations: Abdomen is soft.  Tenderness: There is no abdominal tenderness.  Musculoskeletal:        General: No swelling or deformity. Normal range of motion.     Cervical back: Normal range of motion.  Skin:    General: Skin is warm and dry.     Capillary Refill: Capillary refill takes less than 2 seconds.  Neurological:     General: No focal deficit present.     Mental Status: He is alert and oriented to person, place, and time.     Motor: No weakness.  Psychiatric:        Mood and Affect: Mood normal.        Behavior: Behavior normal.        Thought Content: Thought content normal.   Last CBC Lab Results  Component Value Date   WBC 7.4 03/25/2022   HGB 14.5 03/25/2022   HCT 41.7 03/25/2022   MCV 85 03/25/2022   MCH 29.7 03/25/2022   RDW 13.2  03/25/2022   PLT 187 23/76/2831   Last metabolic panel Lab Results  Component Value Date   GLUCOSE 108 (H) 03/25/2022   NA 139 03/25/2022   K 3.3 (L) 03/25/2022   CL 100 03/25/2022   CO2 22 03/25/2022   BUN 14 03/25/2022   CREATININE 0.74 (L) 03/25/2022   EGFR 110 03/25/2022   CALCIUM 9.1 03/25/2022   PROT 6.9 03/25/2022   ALBUMIN 4.6 03/25/2022   LABGLOB 2.3 03/25/2022   AGRATIO 2.0 03/25/2022   BILITOT 0.4 03/25/2022   ALKPHOS 115 03/25/2022   AST 16 03/25/2022   ALT 13 03/25/2022   Last lipids Lab Results  Component Value Date   CHOL 177 03/25/2022   HDL 45 03/25/2022   LDLCALC 89 03/25/2022   TRIG 259 (H) 03/25/2022   CHOLHDL 3.9 03/25/2022   Last hemoglobin A1c Lab Results  Component Value Date   HGBA1C 5.8 (H) 03/25/2022   Last thyroid functions Lab Results  Component Value Date   TSH 0.868 03/25/2022   Last vitamin D Lab Results  Component Value Date   VD25OH 41.9 03/25/2022   Last vitamin B12 and Folate Lab Results  Component Value Date   VITAMINB12 704 03/25/2022   FOLATE 14.3 03/25/2022   The 10-year ASCVD risk score (Arnett DK, et al., 2019) is: 4.2%    Assessment & Plan:   Problem List Items Addressed This Visit     Hypertension - Primary    Currently prescribed lisinopril-HCTZ 20-12.5 mg daily for treatment of hypertension.  His initial blood pressure today was 145/87 and improved to 128/83 on repeat.  He has regularly checked his blood pressure at home since his last appointment and reports systolic blood pressure readings consistently ranging 130-140 mmHg. -Discontinue lisinopril-HCTZ -Start losartan 50 mg daily and HCTZ 12.5 mg daily. -Plan for video visit in 2 weeks to review home BP readings      Hypertriglyceridemia    Moderate hypertriglyceridemia noted on lipid panel from last month.  He has previously been prescribed fenofibrate. -Results reviewed today, he prefers to focus on dietary changes aimed at improving his  triglyceride level before resuming any additional medication. -Repeat lipid panel in 3 months      Prediabetes    A1c 5.8 on labs from last month.  He plans to make significant dietary changes aimed at lowering his average blood sugar.      Hypokalemia    K+ 3.3 on labs from last month.  Repeat BMP ordered today.  Need for influenza vaccination    Influenza vaccine administered today      Return in about 2 weeks (around 05/06/2022) for HTN.   [name], MD

## 2022-04-22 NOTE — Patient Instructions (Signed)
It was a pleasure to see you today.  Thank you for giving [place] the opportunity to be involved in your care.  Below is a brief recap of your visit and next steps.  We will plan to see you again in 2 weeks.  Summary STOP: combo pill (lisinopril-HCTZ)  START: losartan 50 mg and HCTZ 12.5 mg daily  You will receive your flu shot today Follow up in 2 weeks for video visit

## 2022-04-25 DIAGNOSIS — Z23 Encounter for immunization: Secondary | ICD-10-CM | POA: Insufficient documentation

## 2022-04-25 DIAGNOSIS — R7303 Prediabetes: Secondary | ICD-10-CM | POA: Insufficient documentation

## 2022-04-25 DIAGNOSIS — E876 Hypokalemia: Secondary | ICD-10-CM | POA: Insufficient documentation

## 2022-04-25 NOTE — Assessment & Plan Note (Signed)
A1c 5.8 on labs from last month.  He plans to make significant dietary changes aimed at lowering his average blood sugar.

## 2022-04-25 NOTE — Assessment & Plan Note (Signed)
Moderate hypertriglyceridemia noted on lipid panel from last month.  He has previously been prescribed fenofibrate. -Results reviewed today, he prefers to focus on dietary changes aimed at improving his triglyceride level before resuming any additional medication. -Repeat lipid panel in 3 months

## 2022-04-25 NOTE — Progress Notes (Incomplete)
Established Patient Office Visit  Subjective   Patient ID: Jerry Cooke, male    DOB: 12/20/1970  Age: 52 y.o. MRN: [id number]  Chief Complaint  Patient presents with  . Hypertension   Jerry Cooke returns to care today for HTN follow-up.  He was last seen by me on 1/8 as a new patient presenting to establish care.  He endorsed a history of hypertension at that time and stated that he was prescribed lisinopril-HCTZ 20-12.5 mg daily.  His blood pressure was significantly elevated.  Lisinopril-HCTZ was refilled and 1 month follow-up was arranged for HTN check.  There have been no acute interval events.  Jerry Cooke reports feeling well today.  He is asymptomatic and has no additional concerns to discuss.  He has been checking his blood pressure at home and reports systolic pressures consistently 130-140s.  History reviewed. No pertinent past medical history. Past Surgical History:  Procedure Laterality Date  . ANTERIOR CRUCIATE LIGAMENT REPAIR     Social History   Tobacco Use  . Smoking status: Never  . Smokeless tobacco: Current    Types: Chew  Substance Use Topics  . Alcohol use: Yes  . Drug use: Not Currently   Family History  Problem Relation Age of Onset  . Diabetes Mother    No Known Allergies  Review of Systems  Constitutional:  Negative for chills and fever.  HENT:  Negative for sore throat.   Respiratory:  Negative for cough and shortness of breath.   Cardiovascular:  Negative for chest pain, palpitations and leg swelling.  Gastrointestinal:  Negative for abdominal pain, blood in stool, constipation, diarrhea, nausea and vomiting.  Genitourinary:  Negative for dysuria and hematuria.  Musculoskeletal:  Negative for myalgias.  Skin:  Negative for itching and rash.  Neurological:  Negative for dizziness and headaches.  Psychiatric/Behavioral:  Negative for depression and suicidal ideas.      Objective:     BP (!) 145/87   Pulse 97   Ht 5\' 10"  (1.778 m)   Wt 219 lb  12.8 oz (99.7 kg)   SpO2 97%   BMI 31.54 kg/m  BP Readings from Last 3 Encounters:  04/22/22 128/83  03/25/22 (!) 157/101  03/08/22 (!) 142/104   Physical Exam Vitals reviewed.  Constitutional:      General: He is not in acute distress.    Appearance: Normal appearance. He is obese. He is not ill-appearing.  HENT:     Head: Normocephalic and atraumatic.     Right Ear: External ear normal.     Left Ear: External ear normal.     Nose: Nose normal. No congestion or rhinorrhea.     Mouth/Throat:     Mouth: Mucous membranes are moist.     Pharynx: Oropharynx is clear.  Eyes:     General: No scleral icterus.    Extraocular Movements: Extraocular movements intact.     Conjunctiva/sclera: Conjunctivae normal.     Pupils: Pupils are equal, round, and reactive to light.  Cardiovascular:     Rate and Rhythm: Normal rate and regular rhythm.     Pulses: Normal pulses.     Heart sounds: Normal heart sounds. No murmur heard. Pulmonary:     Effort: Pulmonary effort is normal.     Breath sounds: Normal breath sounds. No wheezing, rhonchi or rales.  Abdominal:     General: Abdomen is flat. Bowel sounds are normal. There is no distension.     Palpations: Abdomen is soft.  Tenderness: There is no abdominal tenderness.  Musculoskeletal:        General: No swelling or deformity. Normal range of motion.     Cervical back: Normal range of motion.  Skin:    General: Skin is warm and dry.     Capillary Refill: Capillary refill takes less than 2 seconds.  Neurological:     General: No focal deficit present.     Mental Status: He is alert and oriented to person, place, and time.     Motor: No weakness.  Psychiatric:        Mood and Affect: Mood normal.        Behavior: Behavior normal.        Thought Content: Thought content normal.   Last CBC Lab Results  Component Value Date   WBC 7.4 03/25/2022   HGB 14.5 03/25/2022   HCT 41.7 03/25/2022   MCV 85 03/25/2022   MCH 29.7 03/25/2022    RDW 13.2 03/25/2022   PLT 187 18/29/9371   Last metabolic panel Lab Results  Component Value Date   GLUCOSE 108 (H) 03/25/2022   NA 139 03/25/2022   K 3.3 (L) 03/25/2022   CL 100 03/25/2022   CO2 22 03/25/2022   BUN 14 03/25/2022   CREATININE 0.74 (L) 03/25/2022   EGFR 110 03/25/2022   CALCIUM 9.1 03/25/2022   PROT 6.9 03/25/2022   ALBUMIN 4.6 03/25/2022   LABGLOB 2.3 03/25/2022   AGRATIO 2.0 03/25/2022   BILITOT 0.4 03/25/2022   ALKPHOS 115 03/25/2022   AST 16 03/25/2022   ALT 13 03/25/2022   Last lipids Lab Results  Component Value Date   CHOL 177 03/25/2022   HDL 45 03/25/2022   LDLCALC 89 03/25/2022   TRIG 259 (H) 03/25/2022   CHOLHDL 3.9 03/25/2022   Last hemoglobin A1c Lab Results  Component Value Date   HGBA1C 5.8 (H) 03/25/2022   Last thyroid functions Lab Results  Component Value Date   TSH 0.868 03/25/2022   Last vitamin D Lab Results  Component Value Date   VD25OH 41.9 03/25/2022   Last vitamin B12 and Folate Lab Results  Component Value Date   VITAMINB12 704 03/25/2022   FOLATE 14.3 03/25/2022      The 10-year ASCVD risk score (Arnett DK, et al., 2019) is: 5.3%    Assessment & Plan:   Problem List Items Addressed This Visit   None   No follow-ups on file.    [name], MD

## 2022-04-25 NOTE — Assessment & Plan Note (Signed)
Currently prescribed lisinopril-HCTZ 20-12.5 mg daily for treatment of hypertension.  His initial blood pressure today was 145/87 and improved to 128/83 on repeat.  He has regularly checked his blood pressure at home since his last appointment and reports systolic blood pressure readings consistently ranging 130-140 mmHg. -Discontinue lisinopril-HCTZ -Start losartan 50 mg daily and HCTZ 12.5 mg daily. -Plan for video visit in 2 weeks to review home BP readings

## 2022-04-25 NOTE — Assessment & Plan Note (Signed)
Influenza vaccine administered today.

## 2022-04-25 NOTE — Assessment & Plan Note (Signed)
K+ 3.3 on labs from last month.  Repeat BMP ordered today.

## 2022-05-07 ENCOUNTER — Telehealth (INDEPENDENT_AMBULATORY_CARE_PROVIDER_SITE_OTHER): Payer: No Typology Code available for payment source | Admitting: Internal Medicine

## 2022-05-07 ENCOUNTER — Encounter: Payer: Self-pay | Admitting: Internal Medicine

## 2022-05-07 DIAGNOSIS — I10 Essential (primary) hypertension: Secondary | ICD-10-CM | POA: Diagnosis not present

## 2022-05-07 NOTE — Assessment & Plan Note (Signed)
Evaluated today for HTN follow-up.  He is currently prescribed losartan 50 mg daily and HCTZ 12.5 mg daily.  He was previously prescribed lisinopril-HCTZ 20-12.5 mg daily.  His recent systolic blood pressure readings have ranged 128-138 mmHg. -No additional medication changes for now.  Consider increasing losartan at follow-up if BP remains elevated. -We will plan for follow-up in 3 months with repeat labs at that time

## 2022-05-07 NOTE — Progress Notes (Signed)
   Virtual Visit via Video Note  I connected with Jerry Cooke on 05/07/22 at  9:00 AM EST by a video enabled telemedicine application and verified that I am speaking with the correct person using two identifiers.  Patient Location: Home Provider Location: Office/Clinic  I discussed the limitations, risks, security, and privacy concerns of performing an evaluation and management service by video and the availability of in person appointments. I also discussed with the patient that there may be a patient responsible charge related to this service. The patient expressed understanding and agreed to proceed.  Subjective: PCP: Jerry Abraham, MD  Chief Complaint  Patient presents with   Hypertension    Follow up   Mr. Jerry Cooke has been evaluated through video encounter today for HTN follow-up.  He was last seen by me on 2/5 at which time his blood pressure remained elevated.  Lisinopril-HCTZ was discontinued and losartan 50 mg daily as well as HCTZ 12.5 mg daily was initiated.  There have been no acute interval events.  Mr. Jerry Cooke reports feeling well today.  He is asymptomatic and has no additional concerns to discuss.  He has been routinely checking his blood pressure and reports systolic readings ranging 128-138 mmHg.  He has been taking his medications as prescribed.  ROS: Per HPI  Current Outpatient Medications:    doxycycline (VIBRAMYCIN) 100 MG capsule, Take 100 mg by mouth 2 (two) times daily., Disp: , Rfl:    fluticasone (FLONASE) 50 MCG/ACT nasal spray, Place 2 sprays into both nostrils daily., Disp: 16 g, Rfl: 0   hydrochlorothiazide (HYDRODIURIL) 25 MG tablet, Take 0.5 tablets (12.5 mg total) by mouth daily., Disp: 90 tablet, Rfl: 3   losartan (COZAAR) 50 MG tablet, Take 1 tablet (50 mg total) by mouth daily., Disp: 30 tablet, Rfl: 2  Assessment and Plan: Primary hypertension Assessment & Plan: Evaluated today for HTN follow-up.  He is currently prescribed losartan 50 mg daily and  HCTZ 12.5 mg daily.  He was previously prescribed lisinopril-HCTZ 20-12.5 mg daily.  His recent systolic blood pressure readings have ranged 128-138 mmHg. -No additional medication changes for now.  Consider increasing losartan at follow-up if BP remains elevated. -We will plan for follow-up in 3 months with repeat labs at that time  Follow Up Instructions: Return in about 3 months (around 08/05/2022).   I discussed the assessment and treatment plan with the patient. The patient was provided an opportunity to ask questions, and all were answered. The patient agreed with the plan and demonstrated an understanding of the instructions.   The patient was advised to call back or seek an in-person evaluation if the symptoms worsen or if the condition fails to improve as anticipated.  The above assessment and management plan was discussed with the patient. The patient verbalized understanding of and has agreed to the management plan.   Jerry Abraham, MD

## 2022-05-08 ENCOUNTER — Other Ambulatory Visit: Payer: Self-pay | Admitting: Internal Medicine

## 2022-05-08 DIAGNOSIS — I10 Essential (primary) hypertension: Secondary | ICD-10-CM

## 2022-07-23 ENCOUNTER — Other Ambulatory Visit: Payer: Self-pay | Admitting: Internal Medicine

## 2022-07-23 DIAGNOSIS — I10 Essential (primary) hypertension: Secondary | ICD-10-CM

## 2022-08-15 ENCOUNTER — Encounter: Payer: Self-pay | Admitting: Emergency Medicine

## 2022-08-15 ENCOUNTER — Ambulatory Visit
Admission: EM | Admit: 2022-08-15 | Discharge: 2022-08-15 | Disposition: A | Discharge status: Home / Self Care | Payer: No Typology Code available for payment source | Attending: Nurse Practitioner | Admitting: Nurse Practitioner

## 2022-08-15 DIAGNOSIS — W57XXXA Bitten or stung by nonvenomous insect and other nonvenomous arthropods, initial encounter: Secondary | ICD-10-CM | POA: Diagnosis not present

## 2022-08-15 DIAGNOSIS — L089 Local infection of the skin and subcutaneous tissue, unspecified: Secondary | ICD-10-CM | POA: Diagnosis not present

## 2022-08-15 DIAGNOSIS — S90562A Insect bite (nonvenomous), left ankle, initial encounter: Secondary | ICD-10-CM | POA: Diagnosis not present

## 2022-08-15 HISTORY — DX: Essential (primary) hypertension: I10

## 2022-08-15 MED ORDER — DOXYCYCLINE HYCLATE 100 MG PO TABS: 100.0000 mg | ORAL_TABLET | Freq: Two times a day (BID) | ORAL | 0 refills | Status: AC

## 2022-08-15 MED ORDER — MUPIROCIN 2 % EX OINT: 1.0000 | TOPICAL_OINTMENT | Freq: Two times a day (BID) | CUTANEOUS | 0 refills | Status: DC

## 2022-08-15 NOTE — ED Provider Notes (Signed)
[facility]    CSN: [id number] Arrival date & time: 08/15/22  1744      History   Chief Complaint No chief complaint on file.   HPI Jerry Cooke is a 52 y.o. male.   The history is provided by the patient.   The patient presents for complaints of an insect bite to the left ankle.  Patient states that he believes he was bitten while he was out [place] the [place] approximately 2 days ago.  He states that the area became itchy.  He states today, he noticed that the area has started draining, and has increased in redness and in size.  Patient states that he has not had fever, chills, chest pain, abdominal pain, nausea, vomiting, or diarrhea.  He does state that his joints have been hurting over the past 2 days.  Patient reports that he drinks about 220 ounces of water daily.  Patient reports that his heart rate, breathing, and blood pressure are elevated because he just left the gym at which time he was doing the stairmaster.  Past Medical History:  Diagnosis Date   Hypertension     Patient Active Problem List   Diagnosis Date Noted   Prediabetes 04/25/2022   Hypokalemia 04/25/2022   Need for influenza vaccination 04/25/2022   Hypertension 03/25/2022   Hypertriglyceridemia 03/25/2022   History of gout 03/25/2022   Encounter for general adult medical examination with abnormal findings 03/25/2022   Peroneal tendinitis of lower leg, right 03/25/2022    Past Surgical History:  Procedure Laterality Date   ANTERIOR CRUCIATE LIGAMENT REPAIR         Home Medications    Prior to Admission medications   Medication Sig Start Date End Date Taking? Authorizing Provider  doxycycline (VIBRA-TABS) 100 MG tablet Take 1 tablet (100 mg total) by mouth 2 (two) times daily for 5 days. 08/15/22 08/20/22 Yes Teja Judice-Warren, Sadie Haber, NP  mupirocin ointment (BACTROBAN) 2 % Apply 1 Application topically 2 (two) times daily. 08/15/22  Yes Mayola Mcbain-Warren, Sadie Haber, NP   hydrochlorothiazide (HYDRODIURIL) 25 MG tablet Take 0.5 tablets (12.5 mg total) by mouth daily. 04/22/22   Billie Lade, MD  losartan (COZAAR) 50 MG tablet TAKE 1 TABLET(50 MG) BY MOUTH DAILY 07/23/22   Billie Lade, MD    Family History Family History  Problem Relation Age of Onset   Diabetes Mother     Social History Social History   Tobacco Use   Smoking status: Never   Smokeless tobacco: Current    Types: Chew  Substance Use Topics   Alcohol use: Yes   Drug use: Not Currently     Allergies   Patient has no known allergies.   Review of Systems Review of Systems Per HPI    Physical Exam Triage Vital Signs ED Triage Vitals  Enc Vitals Group     BP 08/15/22 1749 (!) 154/114     Pulse Rate 08/15/22 1749 (!) 129     Resp 08/15/22 1749 (!) 26     Temp 08/15/22 1749 98.7 F (37.1 C)     Temp Source 08/15/22 1749 Oral     SpO2 08/15/22 1749 97 %     Weight --      Height --      Head Circumference --      Peak Flow --      Pain Score 08/15/22 1751 0     Pain Loc --      Pain  Edu? --      Excl. in GC? --    No data found.  Updated Vital Signs BP (!) 157/103 (BP Location: Right Arm)   Pulse (!) 118   Temp 98.7 F (37.1 C) (Oral)   Resp (!) 26   SpO2 97%   Visual Acuity Right Eye Distance:   Left Eye Distance:   Bilateral Distance:    Right Eye Near:   Left Eye Near:    Bilateral Near:     Physical Exam Vitals and nursing note reviewed.  Constitutional:      General: He is not in acute distress.    Appearance: Normal appearance.  HENT:     Head: Normocephalic.  Eyes:     Extraocular Movements: Extraocular movements intact.     Pupils: Pupils are equal, round, and reactive to light.  Cardiovascular:     Rate and Rhythm: Regular rhythm. Tachycardia present.     Pulses: Normal pulses.     Heart sounds: Normal heart sounds.  Pulmonary:     Effort: Pulmonary effort is normal.     Breath sounds: Normal breath sounds.  Abdominal:      General: Bowel sounds are normal.     Palpations: Abdomen is soft.     Tenderness: There is no abdominal tenderness.  Musculoskeletal:     Cervical back: Normal range of motion.  Lymphadenopathy:     Cervical: No cervical adenopathy.  Skin:    General: Skin is warm and dry.     Findings: Wound present.     Comments: Wound present at the medial malleolus of the left ankle.  Area with an erythematous base, with scabbing present.  There is no oozing, fluctuance, or drainage present.  Neurological:     General: No focal deficit present.     Mental Status: He is alert and oriented to person, place, and time.  Psychiatric:        Mood and Affect: Mood normal.        Behavior: Behavior normal.      UC Treatments / Results  Labs (all labs ordered are listed, but only abnormal results are displayed) Labs Reviewed - No data to display  EKG   Radiology No results found.  Procedures Procedures (including critical care time)  Medications Ordered in UC Medications - No data to display  Initial Impression / Assessment and Plan / UC Course  I have reviewed the triage vital signs and the nursing notes.  Pertinent labs & imaging results that were available during my care of the patient were reviewed by me and considered in my medical decision making (see chart for details).  The patient is well-appearing, he is in no acute distress, vital signs are stable.  Will treat for a skin infection/wound of the left ankle with doxycycline 100 mg twice daily for the next 5 days.  Supportive care recommendations were provided and discussed with the patient to include over-the-counter analgesics for pain or discomfort, cleansing the area at least twice daily, and continue to monitor the area for worsening.  The patient was also advised to follow-up with his primary care physician for continued hypertension and tachycardia.  Patient was advised to go to the emergency department immediately if he  experiences chest pain, shortness of breath, or difficulty breathing.  He was also given strict follow-up precautions for worsening of the skin infection. Patient is in agreement with this plan of care and verbalizes understanding.  All questions were answered.  Patient  stable for discharge.   Final Clinical Impressions(s) / UC Diagnoses   Final diagnoses:  Skin infection  Insect bite of left ankle, initial encounter     Discharge Instructions      Take medication as prescribed. Warm compresses to the affected area 3-4 times daily. Clean the area at least twice daily with Dial Gold bar soap or another type of antibacterial soap. Keep the area covered while it is draining.  Cover the area when you are out in public or at work to prevent risk of infection. Follow-up in this clinic if you continue to experience pain is, increased swelling, or if you develop foul-smelling drainage from the site. Go to the emergency department if you develop fever, chills, generalized fatigue, nausea, vomiting, or if the area of redness spreads i up the leg or into the foot. Please follow-up with your primary care physician for further evaluation of your blood pressure and heart rate.  Go to the emergency department immediately if you experience chest pain, shortness of breath, difficulty breathing, or other concerns. Follow-up as needed.     ED Prescriptions     Medication Sig Dispense Auth. Provider   doxycycline (VIBRA-TABS) 100 MG tablet Take 1 tablet (100 mg total) by mouth 2 (two) times daily for 5 days. 10 tablet Maia Handa-Warren, Sadie Haber, NP   mupirocin ointment (BACTROBAN) 2 % Apply 1 Application topically 2 (two) times daily. 30 g Laithan Conchas-Warren, Sadie Haber, NP      PDMP not reviewed this encounter.   Abran Cantor, NP 08/15/22 1826

## 2022-08-15 NOTE — Discharge Instructions (Addendum)
Take medication as prescribed. Warm compresses to the affected area 3-4 times daily. Clean the area at least twice daily with Dial Gold bar soap or another type of antibacterial soap. Keep the area covered while it is draining.  Cover the area when you are out in public or at work to prevent risk of infection. Follow-up in this clinic if you continue to experience pain is, increased swelling, or if you develop foul-smelling drainage from the site. Go to the emergency department if you develop fever, chills, generalized fatigue, nausea, vomiting, or if the area of redness spreads i up the leg or into the foot. Please follow-up with your primary care physician for further evaluation of your blood pressure and heart rate.  Go to the emergency department immediately if you experience chest pain, shortness of breath, difficulty breathing, or other concerns. Follow-up as needed.

## 2022-08-15 NOTE — ED Triage Notes (Signed)
Thinks he has a spider bite to left ankle x 2 days.  States area itches and his joints "hurt".  States joints have been hurting since Monday.

## 2022-10-23 ENCOUNTER — Other Ambulatory Visit: Payer: Self-pay | Admitting: Internal Medicine

## 2022-10-23 DIAGNOSIS — I10 Essential (primary) hypertension: Secondary | ICD-10-CM

## 2023-01-19 ENCOUNTER — Other Ambulatory Visit: Payer: Self-pay | Admitting: Internal Medicine

## 2023-01-19 DIAGNOSIS — I10 Essential (primary) hypertension: Secondary | ICD-10-CM

## 2023-04-19 ENCOUNTER — Other Ambulatory Visit: Payer: Self-pay | Admitting: Internal Medicine

## 2023-04-19 DIAGNOSIS — I10 Essential (primary) hypertension: Secondary | ICD-10-CM

## 2023-07-06 ENCOUNTER — Other Ambulatory Visit: Payer: Self-pay | Admitting: Internal Medicine

## 2023-07-06 DIAGNOSIS — I10 Essential (primary) hypertension: Secondary | ICD-10-CM

## 2023-07-20 ENCOUNTER — Other Ambulatory Visit: Payer: Self-pay | Admitting: Internal Medicine

## 2023-07-20 DIAGNOSIS — I10 Essential (primary) hypertension: Secondary | ICD-10-CM

## 2023-10-28 ENCOUNTER — Other Ambulatory Visit: Payer: Self-pay | Admitting: Internal Medicine

## 2023-10-28 DIAGNOSIS — I10 Essential (primary) hypertension: Secondary | ICD-10-CM

## 2023-12-26 ENCOUNTER — Other Ambulatory Visit: Payer: Self-pay | Admitting: Internal Medicine

## 2023-12-26 DIAGNOSIS — I10 Essential (primary) hypertension: Secondary | ICD-10-CM

## 2023-12-29 ENCOUNTER — Ambulatory Visit: Payer: Self-pay

## 2023-12-29 NOTE — Telephone Encounter (Signed)
 FYI Only or Action Required?: FYI only for provider.  Patient was last seen in primary care on 05/07/2022 by Melvenia Manus BRAVO, MD.  Called Nurse Triage reporting Elbow Pain.  Symptoms began several months ago.  Interventions attempted: Prescription medications: cortisone shots and Rest, hydration, or home remedies.  Symptoms are: gradually worsening.  Triage Disposition: See PCP When Office is Open (Within 3 Days)  Patient/caregiver understands and will follow disposition?: Yes, will follow disposition  Copied from CRM #[id number]. Topic: Clinical - Red Word Triage >> Dec 29, 2023  8:11 AM Jerry Cooke wrote: Jerry Cooke that prompted transfer to Nurse Triage: Patient calling states he is having left elbow pain, states has been diagnosed with tennis elbow, and received a cortisone shot, helped for short span, however pain has returned.   Patient also reports he out of blood pressure mediation. And would like to have an appointment as soon as possible. Reason for Disposition  [1] MODERATE pain (e.g., interferes with normal activities) AND [2] present > 3 days  Answer Assessment - Initial Assessment Questions 1. ONSET: When did the pain start?     months 2. LOCATION: Where is the pain located?     L elbow 3. PAIN: How bad is the pain? (Scale 1-10; or mild, moderate, severe)     Moderate all the time, hurts worse with certain use 4. WORK OR EXERCISE: Has there been any recent work or exercise that involved this part of the body?     denies 5. CAUSE: What do you think is causing the elbow pain?     Dx with tennis elbow 6. OTHER SYMPTOMS: Do you have any other symptoms? (e.g., neck pain, elbow swelling, rash, fever)     Denies  Pt denies SOB, N/V.  Protocols used: Elbow Pain-A-AH

## 2023-12-31 ENCOUNTER — Other Ambulatory Visit: Payer: Self-pay

## 2023-12-31 ENCOUNTER — Ambulatory Visit (INDEPENDENT_AMBULATORY_CARE_PROVIDER_SITE_OTHER)

## 2023-12-31 ENCOUNTER — Telehealth: Payer: Self-pay

## 2023-12-31 VITALS — BP 151/90 | HR 83 | Ht 70.0 in | Wt 215.1 lb

## 2023-12-31 DIAGNOSIS — M7712 Lateral epicondylitis, left elbow: Secondary | ICD-10-CM

## 2023-12-31 DIAGNOSIS — Z125 Encounter for screening for malignant neoplasm of prostate: Secondary | ICD-10-CM

## 2023-12-31 DIAGNOSIS — E781 Pure hyperglyceridemia: Secondary | ICD-10-CM | POA: Diagnosis not present

## 2023-12-31 DIAGNOSIS — I10 Essential (primary) hypertension: Secondary | ICD-10-CM | POA: Diagnosis not present

## 2023-12-31 DIAGNOSIS — R7303 Prediabetes: Secondary | ICD-10-CM | POA: Diagnosis not present

## 2023-12-31 MED ORDER — LOSARTAN POTASSIUM 50 MG PO TABS: 50.0000 mg | ORAL_TABLET | Freq: Every day | ORAL | 2 refills | Status: DC

## 2023-12-31 MED ORDER — PREDNISONE 10 MG (21) PO TBPK: ORAL_TABLET | ORAL | 0 refills | Status: AC

## 2023-12-31 NOTE — Telephone Encounter (Signed)
 Copied from CRM [id number]. Topic: Clinical - Home Health Verbal Orders >> Dec 31, 2023 12:57 PM Rosaria BRAVO wrote: Caller/Agency: [facility]  Callback Number: [phone] Service Requested: Occupational Therapy Frequency:   Needs to change the order to OT to fit the patient's needs for his elbow.   Any new concerns about the patient? No

## 2023-12-31 NOTE — Telephone Encounter (Signed)
 Order changed.

## 2023-12-31 NOTE — Progress Notes (Signed)
 Established Patient Office Visit  Subjective   Patient ID: Jerry Cooke, male    DOB: 1970-12-20  Age: 53 y.o. MRN: [id number]  Chief Complaint  Patient presents with   Medical Management of Chronic Issues    Elbow pain and medication refill  Pt states Haad tennis elbow since April and can't kick it Pain score right now is a 4     HPI Discussed the use of AI scribe software for clinical note transcription with the patient, who gave verbal consent to proceed.  History of Present Illness   Jerry Cooke is a 53 year old male who presents with severe right elbow pain.  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 - No recent fever or other systemic symptoms  Prior treatments for elbow pain - Biofreeze, Voltaren gel, and natural massage creams provided no significant relief - Steroid injection two months ago provided temporary relief for a few days - Prescribed Z-Pak for inflammation, which resulted in a breakout of boils under his arms - Uses a wrap for support; was able to play golf with it until recently, but pain now persists despite use  Metabolic and cardiovascular risk factors - History of gout, managed by increasing water intake - Prediabetes - History of elevated triglycerides - Currently taking losartan , recently refilled - No blood work in two years; requests laboratory panel      Patient Active Problem List   Diagnosis Date Noted   Left tennis elbow 01/02/2024   Prediabetes 04/25/2022   Hypokalemia 04/25/2022   Need for influenza vaccination 04/25/2022   Hypertension 03/25/2022    Hypertriglyceridemia 03/25/2022   History of gout 03/25/2022   Encounter for general adult medical examination with abnormal findings 03/25/2022   Peroneal tendinitis of lower leg, right 03/25/2022      ROS    Objective:     BP (!) 151/90   Pulse 83   Ht 5' 10 (1.778 m)   Wt 215 lb 1.3 oz (97.6 kg)   SpO2 98%   BMI 30.86 kg/m  BP Readings from Last 3 Encounters:  12/31/23 (!) 151/90  08/15/22 (!) 157/103  04/22/22 128/83   Wt Readings from Last 3 Encounters:  12/31/23 215 lb 1.3 oz (97.6 kg)  04/22/22 219 lb 12.8 oz (99.7 kg)  03/25/22 222 lb 3.2 oz (100.8 kg)     Physical Exam Vitals and nursing note reviewed.  Constitutional:      Appearance: Normal appearance.  HENT:     Head: Normocephalic.  Eyes:     Extraocular Movements: Extraocular movements intact.     Pupils: Pupils are equal, round, and reactive to light.  Cardiovascular:     Rate and Rhythm: Normal rate and regular rhythm.  Pulmonary:     Effort: Pulmonary effort is normal.     Breath sounds: Normal breath sounds.  Musculoskeletal:     Right elbow: Normal.     Left elbow: Swelling present. Decreased range of motion. Tenderness present in medial epicondyle.     Cervical back: Normal range of motion and neck supple.  Neurological:     Mental Status: He  is alert and oriented to person, place, and time.  Psychiatric:        Mood and Affect: Mood normal.        Thought Content: Thought content normal.       The 10-year ASCVD risk score (Arnett DK, et al., 2019) is: 6.5%    Assessment & Plan:   Problem List Items Addressed This Visit       Cardiovascular and Mediastinum   Hypertension   Evaluated today for HTN follow-up.  He is currently prescribed losartan  50 mg daily and HCTZ 12.5 mg daily.   -No medication changes made today.  Consider increasing losartan  at follow-up if BP remains elevated. -We will plan for follow-up in 3 months.      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 - Refer to physical therapy for evaluation and treatment, consider dry needling. - Order MRI of the right elbow to assess soft tissue involvement. - Provide work excuse for one week for rest and recovery. - Advise use of brace with thumb immobilizer to reduce tendon strain. - Instruct to avoid activities exacerbating symptoms, such as golfing and upper body exercises.      Relevant Medications   predniSONE  (STERAPRED UNI-PAK 21 TAB) 10 MG (21) TBPK tablet   Other Relevant Orders   Ambulatory referral to Physical Therapy   MR ELBOW LEFT WO CONTRAST     Other   Hypertriglyceridemia   Prediabetes with previous high triglycerides, no recent labs in two years. - Order blood panel including A1c to assess current status.      Relevant Medications   losartan  (COZAAR ) 50 MG tablet   Other Relevant Orders   CMP14+EGFR (Completed)   Lipid Profile (Completed)   Prediabetes   Prediabetes with previous high triglycerides, no recent labs in two years. - Order blood panel including A1c to assess current status.      Relevant Orders   CMP14+EGFR (Completed)   HgB A1c (Completed)   Other Visit Diagnoses       Prostate cancer screening       Relevant Orders   PSA (Completed)         No follow-ups on file.    Leita Longs, FNP

## 2024-01-01 LAB — CMP14+EGFR
ALT: 10 IU/L (ref 0–44)
AST: 12 IU/L (ref 0–40)
Albumin: 4.3 g/dL (ref 3.8–4.9)
Alkaline Phosphatase: 108 IU/L (ref 47–123)
BUN/Creatinine Ratio: 20 (ref 9–20)
BUN: 18 mg/dL (ref 6–24)
Bilirubin Total: 0.5 mg/dL (ref 0.0–1.2)
CO2: 25 mmol/L (ref 20–29)
Calcium: 9.1 mg/dL (ref 8.7–10.2)
Chloride: 100 mmol/L (ref 96–106)
Creatinine, Ser: 0.91 mg/dL (ref 0.76–1.27)
Globulin, Total: 2.2 g/dL (ref 1.5–4.5)
Glucose: 119 mg/dL — ABNORMAL HIGH (ref 70–99)
Potassium: 3.9 mmol/L (ref 3.5–5.2)
Sodium: 141 mmol/L (ref 134–144)
Total Protein: 6.5 g/dL (ref 6.0–8.5)
eGFR: 101 mL/min/1.73 (ref >59)

## 2024-01-01 LAB — LIPID PANEL
Chol/HDL Ratio: 3.7 ratio (ref 0.0–5.0)
Cholesterol, Total: 178 mg/dL (ref 100–199)
HDL: 48 mg/dL (ref >39)
LDL Chol Calc (NIH): 90 mg/dL (ref 0–99)
Triglycerides: 237 mg/dL — ABNORMAL HIGH (ref 0–149)
VLDL Cholesterol Cal: 40 mg/dL (ref 5–40)

## 2024-01-01 LAB — PSA: Prostate Specific Ag, Serum: 0.4 ng/mL (ref 0.0–4.0)

## 2024-01-01 LAB — HEMOGLOBIN A1C
Est. average glucose Bld gHb Est-mCnc: 123 mg/dL
Hgb A1c MFr Bld: 5.9 % — ABNORMAL HIGH (ref 4.8–5.6)

## 2024-01-02 ENCOUNTER — Ambulatory Visit: Payer: Self-pay

## 2024-01-02 DIAGNOSIS — M7712 Lateral epicondylitis, left elbow: Secondary | ICD-10-CM | POA: Insufficient documentation

## 2024-01-02 NOTE — Assessment & Plan Note (Signed)
 Prediabetes with previous high triglycerides, no recent labs in two years. - Order blood panel including A1c to assess current status.

## 2024-01-02 NOTE — Assessment & Plan Note (Signed)
 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 - Refer to physical therapy for evaluation and treatment, consider dry needling. - Order MRI of the right elbow to assess soft tissue involvement. - Provide work excuse for one week for rest and recovery. - Advise use of brace with thumb immobilizer to reduce tendon strain. - Instruct to avoid activities exacerbating symptoms, such as golfing and upper body exercises.

## 2024-01-02 NOTE — Assessment & Plan Note (Signed)
 Evaluated today for HTN follow-up.  He is currently prescribed losartan  50 mg daily and HCTZ 12.5 mg daily.   -No medication changes made today.  Consider increasing losartan  at follow-up if BP remains elevated. -We will plan for follow-up in 3 months.

## 2024-01-04 ENCOUNTER — Ambulatory Visit (HOSPITAL_COMMUNITY)

## 2024-03-27 ENCOUNTER — Other Ambulatory Visit: Payer: Self-pay

## 2024-03-27 DIAGNOSIS — I10 Essential (primary) hypertension: Secondary | ICD-10-CM
